# Patient Record
Sex: Female | Born: 1935 | Race: White | Hispanic: No | Marital: Married | Smoking: Never smoker
Health system: Southern US, Community
[De-identification: ages and names within clinical notes are randomized; demographics above are authoritative.]

## PROBLEM LIST (undated history)

## (undated) DIAGNOSIS — S22009A Unspecified fracture of unspecified thoracic vertebra, initial encounter for closed fracture: Secondary | ICD-10-CM

## (undated) DIAGNOSIS — R627 Adult failure to thrive: Secondary | ICD-10-CM

## (undated) DIAGNOSIS — S62109A Fracture of unspecified carpal bone, unspecified wrist, initial encounter for closed fracture: Secondary | ICD-10-CM

## (undated) DIAGNOSIS — A0472 Enterocolitis due to Clostridium difficile, not specified as recurrent: Secondary | ICD-10-CM

## (undated) DIAGNOSIS — M810 Age-related osteoporosis without current pathological fracture: Secondary | ICD-10-CM

## (undated) DIAGNOSIS — G609 Hereditary and idiopathic neuropathy, unspecified: Secondary | ICD-10-CM

## (undated) HISTORY — DX: Enterocolitis due to Clostridium difficile, not specified as recurrent: A04.72

## (undated) HISTORY — DX: Hereditary and idiopathic neuropathy, unspecified: G60.9

## (undated) HISTORY — DX: Adult failure to thrive: R62.7

## (undated) HISTORY — DX: Fracture of unspecified carpal bone, unspecified wrist, initial encounter for closed fracture: S62.109A

## (undated) HISTORY — DX: Unspecified fracture of unspecified thoracic vertebra, initial encounter for closed fracture: S22.009A

---

## 1998-03-22 ENCOUNTER — Other Ambulatory Visit: Admission: RE | Admit: 1998-03-22 | Discharge: 1998-03-22 | Payer: Self-pay

## 1998-11-23 ENCOUNTER — Encounter: Payer: Self-pay | Admitting: Emergency Medicine

## 1998-11-23 ENCOUNTER — Emergency Department (HOSPITAL_COMMUNITY): Admission: EM | Admit: 1998-11-23 | Discharge: 1998-11-23 | Payer: Self-pay | Admitting: Emergency Medicine

## 1999-12-01 ENCOUNTER — Inpatient Hospital Stay (HOSPITAL_COMMUNITY): Admission: EM | Admit: 1999-12-01 | Discharge: 1999-12-02 | Payer: Self-pay | Admitting: Family Medicine

## 2000-01-05 ENCOUNTER — Inpatient Hospital Stay (HOSPITAL_COMMUNITY): Admission: EM | Admit: 2000-01-05 | Discharge: 2000-01-15 | Payer: Self-pay | Admitting: Emergency Medicine

## 2000-01-05 ENCOUNTER — Encounter (INDEPENDENT_AMBULATORY_CARE_PROVIDER_SITE_OTHER): Payer: Self-pay

## 2000-01-15 ENCOUNTER — Inpatient Hospital Stay (HOSPITAL_COMMUNITY): Admission: EM | Admit: 2000-01-15 | Discharge: 2000-01-22 | Payer: Self-pay | Admitting: *Deleted

## 2001-11-30 ENCOUNTER — Encounter: Payer: Self-pay | Admitting: Internal Medicine

## 2001-11-30 ENCOUNTER — Ambulatory Visit (HOSPITAL_COMMUNITY): Admission: RE | Admit: 2001-11-30 | Discharge: 2001-11-30 | Payer: Self-pay | Admitting: Internal Medicine

## 2002-04-29 ENCOUNTER — Ambulatory Visit (HOSPITAL_COMMUNITY): Admission: RE | Admit: 2002-04-29 | Discharge: 2002-04-29 | Payer: Self-pay | Admitting: Internal Medicine

## 2002-04-29 ENCOUNTER — Encounter: Payer: Self-pay | Admitting: Internal Medicine

## 2004-01-29 ENCOUNTER — Ambulatory Visit (HOSPITAL_COMMUNITY): Admission: RE | Admit: 2004-01-29 | Discharge: 2004-01-29 | Payer: Self-pay | Admitting: Internal Medicine

## 2004-10-10 ENCOUNTER — Ambulatory Visit: Payer: Self-pay | Admitting: Internal Medicine

## 2005-01-07 ENCOUNTER — Ambulatory Visit: Payer: Self-pay | Admitting: Internal Medicine

## 2005-04-23 ENCOUNTER — Ambulatory Visit: Payer: Self-pay | Admitting: Internal Medicine

## 2005-05-01 ENCOUNTER — Ambulatory Visit: Payer: Self-pay | Admitting: Family Medicine

## 2006-09-23 ENCOUNTER — Ambulatory Visit: Payer: Self-pay | Admitting: Internal Medicine

## 2006-10-16 ENCOUNTER — Ambulatory Visit: Payer: Self-pay | Admitting: Internal Medicine

## 2006-10-17 ENCOUNTER — Ambulatory Visit: Payer: Self-pay | Admitting: Internal Medicine

## 2007-03-12 ENCOUNTER — Emergency Department (HOSPITAL_COMMUNITY): Admission: EM | Admit: 2007-03-12 | Discharge: 2007-03-12 | Payer: Self-pay | Admitting: Emergency Medicine

## 2007-03-25 ENCOUNTER — Encounter: Admission: RE | Admit: 2007-03-25 | Discharge: 2007-03-25 | Payer: Self-pay | Admitting: Orthopedic Surgery

## 2007-04-30 ENCOUNTER — Encounter: Admission: RE | Admit: 2007-04-30 | Discharge: 2007-04-30 | Payer: Self-pay | Admitting: Orthopedic Surgery

## 2007-05-25 ENCOUNTER — Ambulatory Visit: Payer: Self-pay | Admitting: Internal Medicine

## 2007-05-25 LAB — CONVERTED CEMR LAB
AST: 30 units/L (ref 0–37)
Albumin: 3.2 g/dL — ABNORMAL LOW (ref 3.5–5.2)
Basophils Absolute: 0 10*3/uL (ref 0.0–0.1)
Bilirubin, Direct: 0.1 mg/dL (ref 0.0–0.3)
Chloride: 99 meq/L (ref 96–112)
Cholesterol: 166 mg/dL (ref 0–200)
Creatinine, Ser: 0.6 mg/dL (ref 0.4–1.2)
Eosinophils Absolute: 0.1 10*3/uL (ref 0.0–0.6)
Eosinophils Relative: 2.7 % (ref 0.0–5.0)
Glucose, Bld: 90 mg/dL (ref 70–99)
HCT: 37.1 % (ref 36.0–46.0)
Hemoglobin: 13.1 g/dL (ref 12.0–15.0)
MCHC: 35.2 g/dL (ref 30.0–36.0)
MCV: 90.1 fL (ref 78.0–100.0)
Monocytes Absolute: 1.3 10*3/uL — ABNORMAL HIGH (ref 0.2–0.7)
Neutrophils Relative %: 37.5 % — ABNORMAL LOW (ref 43.0–77.0)
Potassium: 3.7 meq/L (ref 3.5–5.1)
RBC: 4.12 M/uL (ref 3.87–5.11)
RDW: 12.1 % (ref 11.5–14.6)
Sodium: 139 meq/L (ref 135–145)
TSH: 1.67 microintl units/mL (ref 0.35–5.50)
Total Bilirubin: 0.6 mg/dL (ref 0.3–1.2)
Total CHOL/HDL Ratio: 3.1
WBC: 5 10*3/uL (ref 4.5–10.5)

## 2007-09-06 DIAGNOSIS — F3289 Other specified depressive episodes: Secondary | ICD-10-CM | POA: Insufficient documentation

## 2007-09-06 DIAGNOSIS — F411 Generalized anxiety disorder: Secondary | ICD-10-CM | POA: Insufficient documentation

## 2007-09-06 DIAGNOSIS — S62109A Fracture of unspecified carpal bone, unspecified wrist, initial encounter for closed fracture: Secondary | ICD-10-CM

## 2007-09-06 DIAGNOSIS — F329 Major depressive disorder, single episode, unspecified: Secondary | ICD-10-CM

## 2007-09-06 DIAGNOSIS — Z85828 Personal history of other malignant neoplasm of skin: Secondary | ICD-10-CM | POA: Insufficient documentation

## 2007-09-06 HISTORY — DX: Fracture of unspecified carpal bone, unspecified wrist, initial encounter for closed fracture: S62.109A

## 2007-09-09 ENCOUNTER — Encounter: Payer: Self-pay | Admitting: Internal Medicine

## 2007-09-09 DIAGNOSIS — M810 Age-related osteoporosis without current pathological fracture: Secondary | ICD-10-CM | POA: Insufficient documentation

## 2007-09-09 DIAGNOSIS — A0472 Enterocolitis due to Clostridium difficile, not specified as recurrent: Secondary | ICD-10-CM

## 2007-09-09 DIAGNOSIS — R519 Headache, unspecified: Secondary | ICD-10-CM | POA: Insufficient documentation

## 2007-09-09 DIAGNOSIS — E785 Hyperlipidemia, unspecified: Secondary | ICD-10-CM

## 2007-09-09 DIAGNOSIS — R51 Headache: Secondary | ICD-10-CM | POA: Insufficient documentation

## 2007-09-09 HISTORY — DX: Enterocolitis due to Clostridium difficile, not specified as recurrent: A04.72

## 2007-10-07 ENCOUNTER — Telehealth (INDEPENDENT_AMBULATORY_CARE_PROVIDER_SITE_OTHER): Payer: Self-pay | Admitting: *Deleted

## 2007-10-07 ENCOUNTER — Ambulatory Visit: Payer: Self-pay | Admitting: Internal Medicine

## 2007-10-08 ENCOUNTER — Encounter: Payer: Self-pay | Admitting: Internal Medicine

## 2007-10-28 ENCOUNTER — Telehealth (INDEPENDENT_AMBULATORY_CARE_PROVIDER_SITE_OTHER): Payer: Self-pay | Admitting: *Deleted

## 2007-11-08 ENCOUNTER — Ambulatory Visit: Payer: Self-pay | Admitting: Internal Medicine

## 2007-12-27 ENCOUNTER — Telehealth (INDEPENDENT_AMBULATORY_CARE_PROVIDER_SITE_OTHER): Payer: Self-pay | Admitting: *Deleted

## 2007-12-28 ENCOUNTER — Ambulatory Visit: Payer: Self-pay | Admitting: Internal Medicine

## 2007-12-29 ENCOUNTER — Encounter: Payer: Self-pay | Admitting: Internal Medicine

## 2007-12-29 DIAGNOSIS — S22009A Unspecified fracture of unspecified thoracic vertebra, initial encounter for closed fracture: Secondary | ICD-10-CM

## 2007-12-29 HISTORY — DX: Unspecified fracture of unspecified thoracic vertebra, initial encounter for closed fracture: S22.009A

## 2008-01-03 ENCOUNTER — Encounter: Admission: RE | Admit: 2008-01-03 | Discharge: 2008-01-03 | Payer: Self-pay | Admitting: Internal Medicine

## 2008-01-06 ENCOUNTER — Encounter: Payer: Self-pay | Admitting: Internal Medicine

## 2008-02-15 ENCOUNTER — Encounter: Payer: Self-pay | Admitting: Interventional Radiology

## 2008-02-22 ENCOUNTER — Ambulatory Visit (HOSPITAL_COMMUNITY): Admission: RE | Admit: 2008-02-22 | Discharge: 2008-02-22 | Payer: Self-pay | Admitting: Interventional Radiology

## 2008-03-09 ENCOUNTER — Encounter: Payer: Self-pay | Admitting: Interventional Radiology

## 2008-03-09 ENCOUNTER — Telehealth: Payer: Self-pay | Admitting: Internal Medicine

## 2008-06-14 ENCOUNTER — Ambulatory Visit: Payer: Self-pay | Admitting: Internal Medicine

## 2008-06-14 LAB — CONVERTED CEMR LAB
AST: 30 units/L (ref 0–37)
Albumin: 3.8 g/dL (ref 3.5–5.2)
BUN: 16 mg/dL (ref 6–23)
Basophils Absolute: 0.1 10*3/uL (ref 0.0–0.1)
Basophils Relative: 2.2 % — ABNORMAL HIGH (ref 0.0–1.0)
Calcium: 9.5 mg/dL (ref 8.4–10.5)
Chloride: 100 meq/L (ref 96–112)
Cholesterol: 163 mg/dL (ref 0–200)
Creatinine, Ser: 0.7 mg/dL (ref 0.4–1.2)
Eosinophils Absolute: 0 10*3/uL (ref 0.0–0.7)
Eosinophils Relative: 0.4 % (ref 0.0–5.0)
GFR calc non Af Amer: 87 mL/min
HCT: 40.4 % (ref 36.0–46.0)
Hemoglobin: 14 g/dL (ref 12.0–15.0)
MCHC: 34.6 g/dL (ref 30.0–36.0)
MCV: 90.2 fL (ref 78.0–100.0)
Monocytes Absolute: 1.8 10*3/uL — ABNORMAL HIGH (ref 0.1–1.0)
Neutro Abs: 1.6 10*3/uL (ref 1.4–7.7)
Neutrophils Relative %: 31.1 % — ABNORMAL LOW (ref 43.0–77.0)
RBC: 4.48 M/uL (ref 3.87–5.11)
TSH: 1.37 microintl units/mL (ref 0.35–5.50)
WBC: 5.1 10*3/uL (ref 4.5–10.5)

## 2008-06-15 LAB — CONVERTED CEMR LAB: Vit D, 1,25-Dihydroxy: 46 (ref 30–89)

## 2008-08-08 ENCOUNTER — Ambulatory Visit: Payer: Self-pay | Admitting: Internal Medicine

## 2008-08-08 ENCOUNTER — Telehealth (INDEPENDENT_AMBULATORY_CARE_PROVIDER_SITE_OTHER): Payer: Self-pay | Admitting: *Deleted

## 2008-08-08 DIAGNOSIS — G609 Hereditary and idiopathic neuropathy, unspecified: Secondary | ICD-10-CM

## 2008-08-08 DIAGNOSIS — R627 Adult failure to thrive: Secondary | ICD-10-CM

## 2008-08-08 HISTORY — DX: Adult failure to thrive: R62.7

## 2008-08-08 HISTORY — DX: Hereditary and idiopathic neuropathy, unspecified: G60.9

## 2008-08-18 ENCOUNTER — Encounter: Payer: Self-pay | Admitting: Internal Medicine

## 2008-08-23 ENCOUNTER — Encounter: Payer: Self-pay | Admitting: Internal Medicine

## 2008-08-28 ENCOUNTER — Encounter: Payer: Self-pay | Admitting: Internal Medicine

## 2008-09-21 ENCOUNTER — Encounter: Payer: Self-pay | Admitting: Internal Medicine

## 2008-10-02 ENCOUNTER — Ambulatory Visit: Payer: Self-pay | Admitting: Internal Medicine

## 2008-10-03 ENCOUNTER — Encounter: Payer: Self-pay | Admitting: Internal Medicine

## 2008-10-06 ENCOUNTER — Telehealth (INDEPENDENT_AMBULATORY_CARE_PROVIDER_SITE_OTHER): Payer: Self-pay | Admitting: *Deleted

## 2008-10-10 ENCOUNTER — Encounter: Payer: Self-pay | Admitting: Internal Medicine

## 2008-10-11 ENCOUNTER — Encounter: Payer: Self-pay | Admitting: Internal Medicine

## 2008-10-20 ENCOUNTER — Telehealth (INDEPENDENT_AMBULATORY_CARE_PROVIDER_SITE_OTHER): Payer: Self-pay | Admitting: *Deleted

## 2008-10-24 ENCOUNTER — Encounter: Payer: Self-pay | Admitting: Internal Medicine

## 2008-12-06 ENCOUNTER — Encounter: Payer: Self-pay | Admitting: Internal Medicine

## 2009-03-30 IMAGING — CR DG LUMBAR SPINE COMPLETE 4+V
5 series · 5 of 5 positions shown · non-contrast
Comparison: none

CLINICAL DATA: Severe low back pain.  No injury. 
LUMBAR SPINE FOUR VIEWS:
Four views of the lumbar spine were obtained.  There is a compression deformity of T12 vertebral body by approximately 40%.  The lumbar vertebrae are in normal alignment with normal disc spaces.  The bones are diffusely osteopenic.   No definite retropulsion is seen.  The SI joints appear normal.

[t l-spine a.p.]
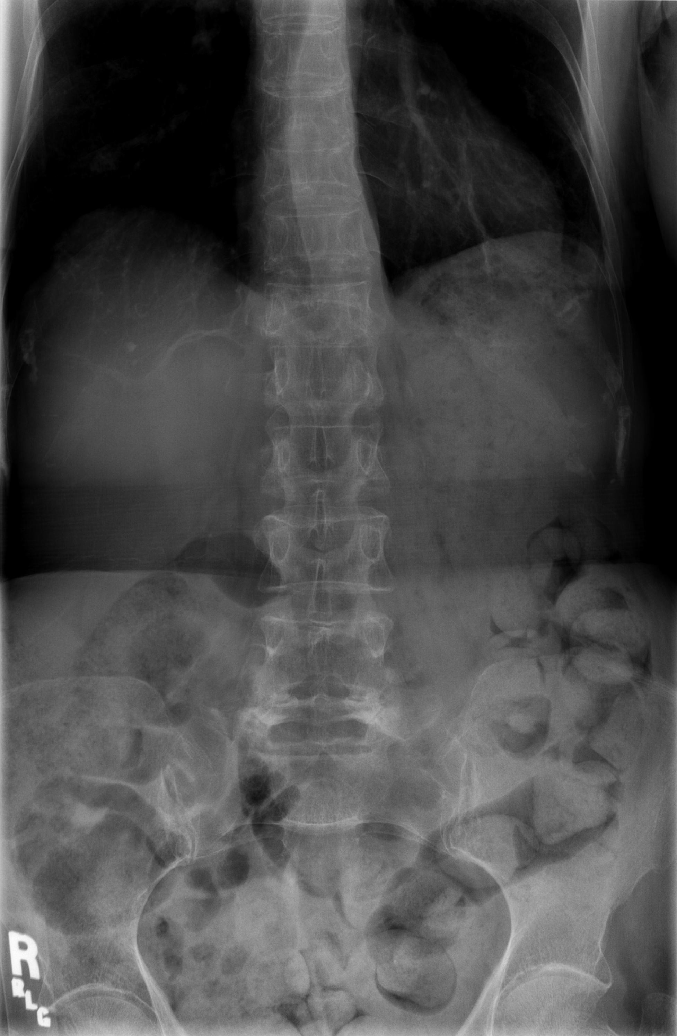

[t l-spine oblique exposure (1 of 2)]
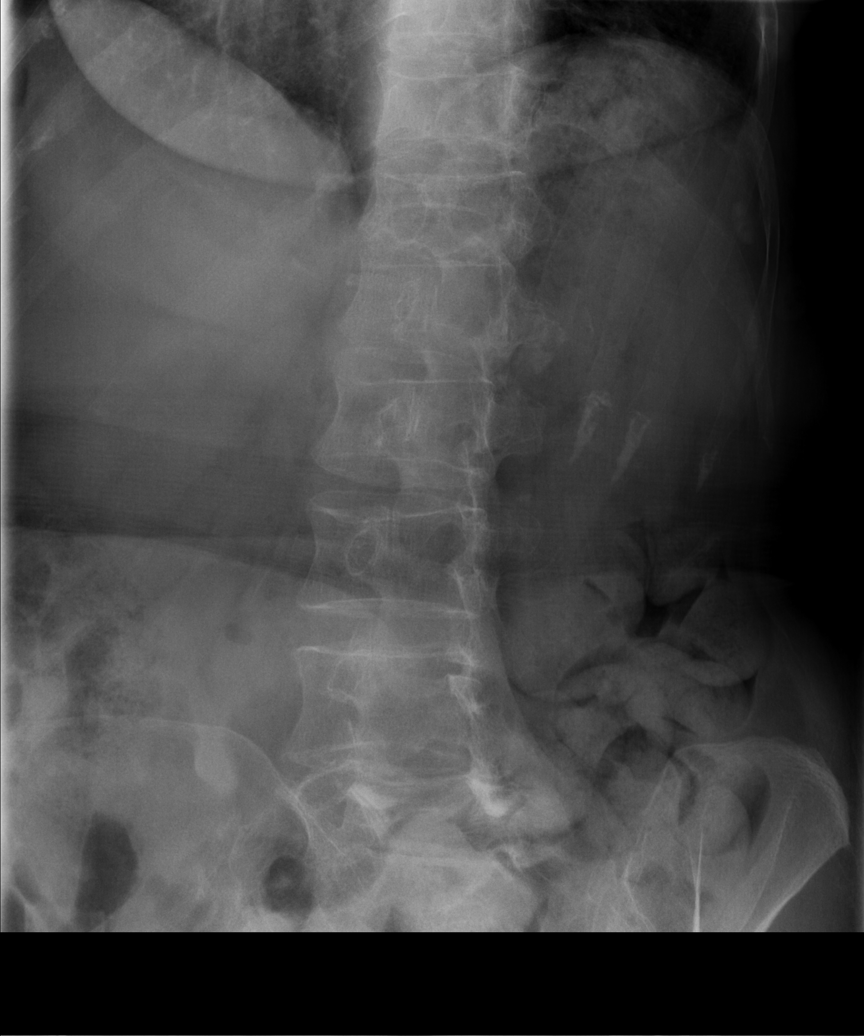

[t l-spine oblique exposure (2 of 2)]
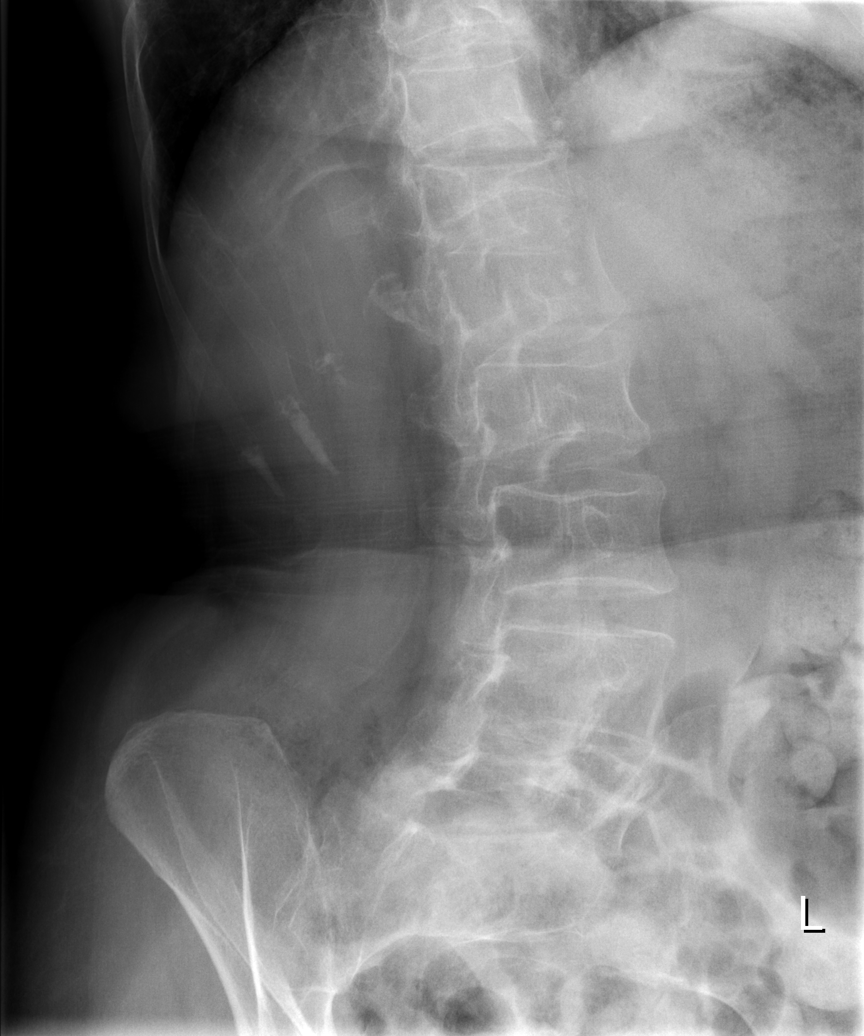

[t l-spine lat]
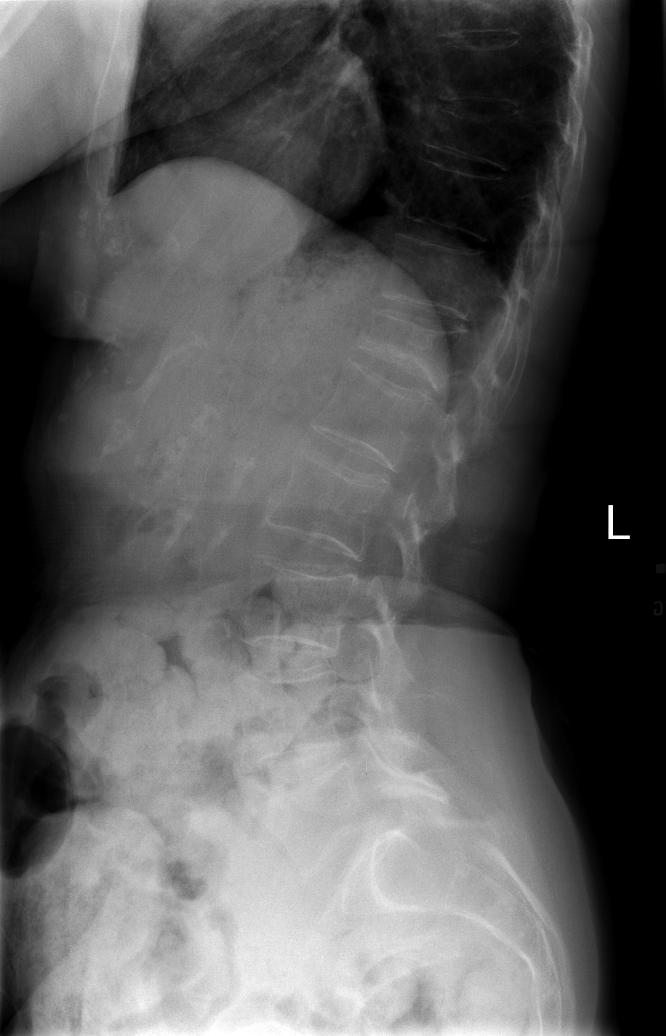

[t l-spine l5-s1 spot]
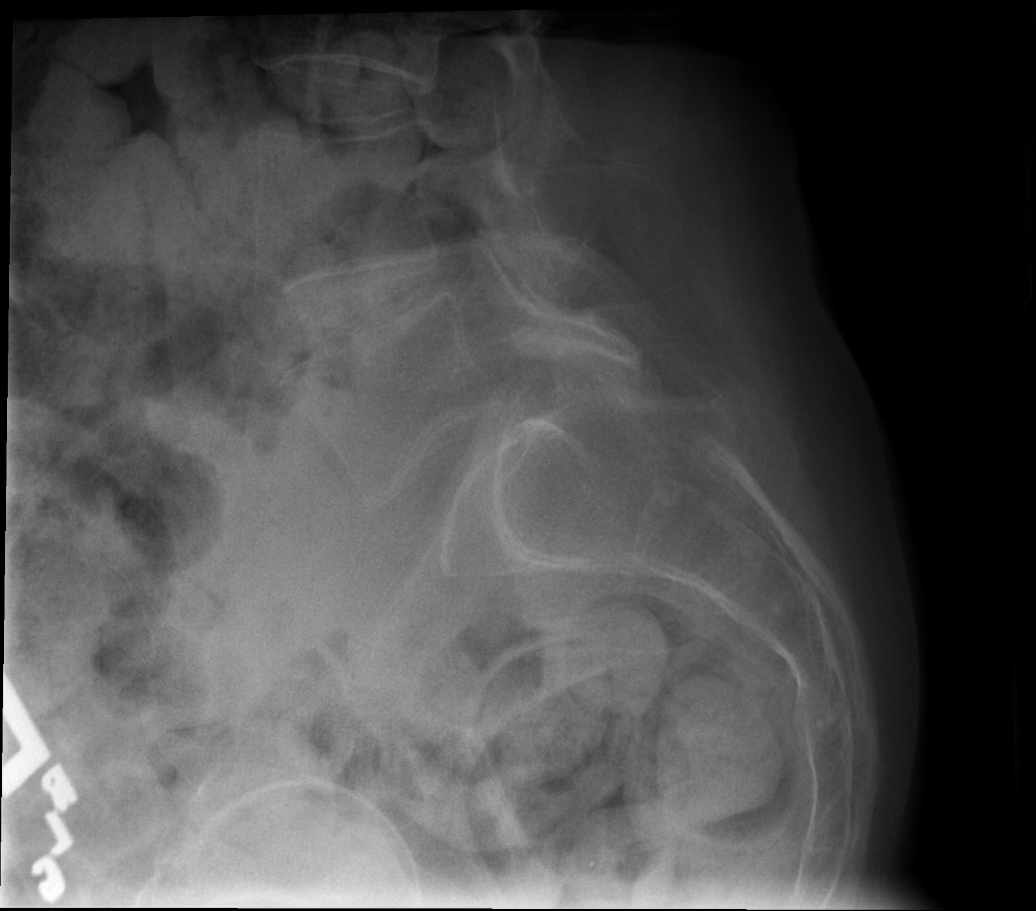

[5 of 5 positions shown; findings below may reference images not displayed]

IMPRESSION: Approximately 40% compression deformity of T12.  No evidence of retropulsion.  Normal alignment.

## 2009-10-01 ENCOUNTER — Telehealth: Payer: Self-pay | Admitting: Internal Medicine

## 2009-10-02 ENCOUNTER — Telehealth: Payer: Self-pay | Admitting: Internal Medicine

## 2009-10-11 ENCOUNTER — Ambulatory Visit: Payer: Self-pay | Admitting: Internal Medicine

## 2009-10-11 LAB — CONVERTED CEMR LAB
AST: 38 units/L — ABNORMAL HIGH (ref 0–37)
Alkaline Phosphatase: 244 units/L — ABNORMAL HIGH (ref 39–117)
BUN: 17 mg/dL (ref 6–23)
Creatinine, Ser: 0.6 mg/dL (ref 0.4–1.2)
Eosinophils Relative: 1.9 % (ref 0.0–5.0)
GFR calc non Af Amer: 103.01 mL/min (ref >60)
HCT: 42 % (ref 36.0–46.0)
HDL: 67.7 mg/dL (ref >39.00)
Ketones, ur: NEGATIVE mg/dL
LDL Cholesterol: 89 mg/dL (ref 0–99)
Monocytes Relative: 34.4 % — ABNORMAL HIGH (ref 3.0–12.0)
Neutrophils Relative %: 29.2 % — ABNORMAL LOW (ref 43.0–77.0)
Platelets: 207 10*3/uL (ref 150.0–400.0)
Potassium: 3.9 meq/L (ref 3.5–5.1)
RBC: 4.53 M/uL (ref 3.87–5.11)
Specific Gravity, Urine: 1.015 (ref 1.000–1.030)
Total Bilirubin: 0.6 mg/dL (ref 0.3–1.2)
Total CHOL/HDL Ratio: 2
Total Protein, Urine: NEGATIVE mg/dL
Triglycerides: 58 mg/dL (ref 0.0–149.0)
Urine Glucose: NEGATIVE mg/dL
Vitamin B-12: 1155 pg/mL — ABNORMAL HIGH (ref 211–911)
WBC: 5.4 10*3/uL (ref 4.5–10.5)
pH: 8.5 (ref 5.0–8.0)

## 2009-10-12 ENCOUNTER — Encounter: Payer: Self-pay | Admitting: Internal Medicine

## 2009-10-15 ENCOUNTER — Telehealth: Payer: Self-pay | Admitting: Internal Medicine

## 2009-12-01 ENCOUNTER — Emergency Department (HOSPITAL_COMMUNITY): Admission: EM | Admit: 2009-12-01 | Discharge: 2009-12-01 | Payer: Self-pay | Admitting: Emergency Medicine

## 2009-12-01 ENCOUNTER — Telehealth: Payer: Self-pay | Admitting: Internal Medicine

## 2009-12-03 ENCOUNTER — Ambulatory Visit: Payer: Self-pay | Admitting: Internal Medicine

## 2009-12-10 ENCOUNTER — Telehealth: Payer: Self-pay | Admitting: Internal Medicine

## 2011-03-17 LAB — CBC
Hemoglobin: 13.6 g/dL (ref 12.0–15.0)
MCHC: 33.6 g/dL (ref 30.0–36.0)
RBC: 4.5 MIL/uL (ref 3.87–5.11)

## 2011-03-17 LAB — DIFFERENTIAL
Basophils Relative: 0 % (ref 0–1)
Eosinophils Absolute: 0.1 10*3/uL (ref 0.0–0.7)
Eosinophils Relative: 2 % (ref 0–5)
Lymphocytes Relative: 30 % (ref 12–46)
Monocytes Relative: 30 % — ABNORMAL HIGH (ref 3–12)
Neutro Abs: 2.1 10*3/uL (ref 1.7–7.7)

## 2011-03-17 LAB — POCT CARDIAC MARKERS
CKMB, poc: 1.8 ng/mL (ref 1.0–8.0)
CKMB, poc: 2.1 ng/mL (ref 1.0–8.0)
Myoglobin, poc: 75.8 ng/mL (ref 12–200)
Troponin i, poc: <0.05 ng/mL (ref 0.00–0.09)
Troponin i, poc: <0.05 ng/mL (ref 0.00–0.09)

## 2011-03-17 LAB — BASIC METABOLIC PANEL
CO2: 28 mEq/L (ref 19–32)
Calcium: 9 mg/dL (ref 8.4–10.5)
GFR calc Af Amer: >60 mL/min (ref >60)
Sodium: 133 mEq/L — ABNORMAL LOW (ref 135–145)

## 2011-03-17 LAB — D-DIMER, QUANTITATIVE: D-Dimer, Quant: 1.33 ug/mL-FEU — ABNORMAL HIGH (ref 0.00–0.48)

## 2011-04-29 NOTE — Consult Note (Signed)
NAME:  Allison Callahan, Allison Callahan           ACCOUNT NO.:  [id number]   MEDICAL RECORD NO.:  [id number]          PATIENT TYPE:  OUT   LOCATION:  XRAY                         FACILITY:  [facility]   PHYSICIAN:  Sanjeev K. Deveshwar, M.D.DATE OF BIRTH:  Apr 20, 1930   DATE OF CONSULTATION:  02/15/2008  DATE OF DISCHARGE:                                 CONSULTATION   DATE OF CONSULTATION:  February 15, 2008.   CHIEF COMPLAINT:  Compression fractures.   HISTORY OF PRESENT ILLNESS:  This is a very pleasant 77 year old female  who was referred to Dr. Corliss Skains for evaluation of compression  fractures.  The patient was seen on January 06, 2008.  She had an MRI  performed January 03, 2008 that showed severe subacute compression  fractures at T8 and T12.  She had an old healed fracture at T9.  At that  time  Dr. Corliss Skains had recommended a vertebroplasty due to the severity of  the fractures.  He did feel that T8 could be treated.  However, T12 was  too compressed, and he did not feel it would be safe to treat this  fracture.  The fracture at T9 was old and healed, and no intervention  was indicated here as well.  At that time Dr. Corliss Skains  spent  approximately 40 minutes with the patient and her husband describing the  kyphoplasty and vertebroplasty procedures including the risks and  benefits.  He reviewed the results of the MRI, and showed them the  images.  The patient and her husband decided that they wanted to give  the procedures some more thought before scheduling.  We recently heard  back from them.  They requested a repeat consult and were seen today  February 15, 2008.   PAST MEDICAL HISTORY:  Significant for hyperlipidemia, anxiety,  depression, osteoporosis.  The patient has had several falls.  She had a  right metatarsal fracture in March 2008, and was treated by Dr. Montez Morita.  She has a history of a C. difficile infection.  She has a history of  headaches.  She had a chest x-ray December 28, 2007  that showed new  cardiomegaly with small bilateral pleural effusions with question of  COPD, although the patient has never smoked.   PAST SURGICAL HISTORY:  The patient is status post appendectomy.  She  denies previous problems with anesthesia.   ALLERGIES:  No known drug allergies.   MEDICATIONS:  The patient had previously been on Darvocet and Flexeril  p.r.n. for her back pain.  She also was on vitamins and Os-Cal.   SOCIAL HISTORY:  The patient is married.  They have one adult son who  lives in [place].  The patient and her husband live in [place].  She has never smoked or used alcohol.  She is a retired TEFL teacher.   FAMILY HISTORY:  Her mother died at age 60 from congestive heart  failure.  Father died at age 71 from a CVA.   IMPRESSION/PLAN:  As noted the patient and her husband requested a  follow-up visit for further discussions regarding treatment  of the  patient's compression fracture.  Since our last visit the patient's  husband has been on the Internet and also to the [facility]  He has done his own research on the vertebroplasty and  kyphoplasty procedures, and is convinced that his wife should now have  the procedure for relief of her pain and stabilization of the fracture.   Once again Dr. Corliss Skains did review the results of the patient's MRI  that was performed on January 03, 2008.  He again pointed out the severe  fractures at T8 and T12.  He felt T8 was the only fracture that could be  treated, although the patient's fracture could have actually worsened  since the time of the MRI which again was January 03, 2008.   Apparently the patient's pain has improved somewhat.  She previously  described her pain as an 8 on a 1-10 scale.  She now says it is a 4 on a  1-10 scale.  However, I believe her husband thinks that the pain is  actually worse.  He still feels that his wife is quite debilitated, and   is unable to participate in her usual household activities as well as  their social activities.  She had been on Darvocet and Flexeril for  pain.  She now rarely takes any pain medication.  However, this may be  due to the patient's fear of dependency on these medications.   Dr. Corliss Skains again spent greater than 40 minutes with the patient and  her husband discussing the treatment options.  Dr. Corliss Skains felt that  since the patient has improved that it might be beneficial to give her  more time to see if she will improve completely.  The patient and her  husband are concerned that the fracture may continue to get worse, and  they would like to proceed with treatment.  We have scheduled her for a  vertebroplasty at the T8 level to be performed Tuesday, March 10, at  12:30.  All of their questions were answered.  The patient and her  husband are in agreement with this plan.      Delton See, P.A.    ______________________________  Grandville Silos. Corliss Skains, M.D.    DR/MEDQ  D:  02/15/2008  T:  02/16/2008  Job:  [id number]   cc:   Corwin Levins, MD

## 2011-04-29 NOTE — Consult Note (Signed)
NAME:  Allison Callahan, Allison Callahan           ACCOUNT NO.:  [id number]   MEDICAL RECORD NO.:  [id number]          PATIENT TYPE:  OUT   LOCATION:  XRAY                         FACILITY:  [facility]   PHYSICIAN:  Sanjeev K. Deveshwar, M.D.DATE OF BIRTH:  [birth year]   DATE OF CONSULTATION:  01/06/2008  DATE OF DISCHARGE:                                 CONSULTATION   CHIEF COMPLAINT:  Compression fracture.   HISTORY OF PRESENT ILLNESS:  This is a very pleasant 77 year old female  who developed back pain on January 8 which came on suddenly.  The  patient denies any history of fall or injury.  She did have a fall back  in March 2008, at which time she injured her leg and had some back pain  associated with this.  However, this current pain appears to be a new  pain.  The patient has had an MRI of the thoracic spine that was  performed on January 03, 2008.  This showed T8 and T12 compression  fractures with subacute components.  There was an old healed fracture at  the T9 level, which appeared to be stable.  The patient states her pain  has been an 8 on a 1-10 scale.  It has been associated with shortness of  breath and inability to drive a car as well as inability to perform her  usual activities of daily living.  She is currently being treated  Darvocet and Flexeril which does provide some relief.  However, she is  still in a great deal of pain.  She presents today accompanied by her  husband to be seen and evaluated by Dr. Corliss Skains.   PAST MEDICAL HISTORY:  1. Hyperlipidemia.  2. Anxiety.  3. Depression.  4. Osteoporosis.  5. As noted, she had a fall in March 2008, and had a right metatarsal      fracture.  She was seen by Dr. Montez Morita.  6. She has a previous history of Clostridium difficile.  7. History of headaches.  8. She had a chest x-ray on December 28, 2007 that showed new      cardiomegaly, with small bilateral pleural effusions, with a      question of COPD. However, the patient has never  smoked.   SURGICAL HISTORY:  1. The patient is status post appendectomy.  2. She denies any previous problems with anesthesia.   ALLERGIES:  NO KNOWN DRUG ALLERGIES.  She denies allergies to shrimp,  iodine, shellfish, or latex.   CURRENT MEDICATIONS:  Darvocet and Flexeril p.r.n.  She also takes  vitamins and Os-Cal.   SOCIAL HISTORY:  The patient is married.  They have one adult son, who  lives in [place].  The patient and her husband live here in  [place].  She has never smoked or used alcohol.  She is a retired  Dietitian.   FAMILY HISTORY:  Her mother died at age 75 from congestive heart  failure.  Her father died at age 11 from a CVA.   IMPRESSION AND PLAN:  As noted, the patient has two subacute compression  fractures on her  recent MRI performed January 03, 2008.  She has been in  a great deal of pain despite pain medications and limited mobility.  Dr.  Corliss Skains reviewed the images from her MRI with the patient and her  husband.  He pointed out the fractures.  The T12 fractures is quite  severe and has some degree of retropulsion associated with the fracture.  Dr. Corliss Skains felt that both fractures would require a vertebroplasty as  opposed to a kyphoplasty due to the severity of the fractures.  As  noted, the T9 fracture is old and has already healed and stabilized.   The vertebroplasty and kyphoplasty procedures were described in great  detail, along with the potential benefits and all of the risks,  including but not limited to subsequent fractures, bleeding, infection,  extravasation of the bone cement, as well as potential injury to the  spinal cord itself.  They were also given some written information on  the kyphoplasty procedure to study at home.   The patient and her husband would like to give this procedure some more  thought.  They will return home and call us if they decide to proceed  with the intervention.  Greater than 40  minutes was spent on this  consult.      Delton See, P.A.    ______________________________  Grandville Silos. Corliss Skains, M.D.    DR/MEDQ  D:  01/06/2008  T:  01/07/2008  Job:  [id number]   cc:   Corwin Levins, MD

## 2011-04-29 NOTE — Assessment & Plan Note (Signed)
[facility]                                 ON-CALL NOTE   NAME:Allison Callahan, PLAKE                    MRN:          [id number]  DATE:12/25/2007                            DOB:          January 30, 1930    TIME OF CALL:  9:05am   TELEPHONE NUMBER:  [phone]   CALLER:  Barnett Abu, husband.   The patient has severe back pain since Thursday. She may have taken too  much medication. She is taking Tylenol basically. She is taking 2 every  4 hours, sometimes every 6 hours of extra strength. However, she only  took 3 on Thursday, took 4 doses on Friday. I spoke with the patient and  the husband simultaneously on the phone. I told her that she could take  extra strength Tylenol 4 times a day routinely for a while without  difficulty as she has no liver problems and is on no other medications  that are a problem. If her pain does not improve, to the ER for  evaluation.   PRIMARY CARE PHYSICIAN:  Dr. Jonny Ruiz.   HOME OFFICE:  Hope Budds, MD  Electronically Signed    RNS/MedQ  DD: 12/25/2007  DT: 12/26/2007  Job #: [id number]

## 2011-05-02 NOTE — Assessment & Plan Note (Signed)
[facility]                            CARDIOLOGY OFFICE NOTE   NAME:Allison Callahan, Allison Callahan                    MRN:          [id number]  DATE:07/07/2007                            DOB:          [birth year]    Ms. Allison Stamwitz fell bringing her husband to have a CT scan today at  the [facility]  She scraped her right knee which did not look  fractured to my visualization.  She also hit her right wrist.  She has  had a previous fracture of her left wrist with a fall.   We sent her over to [facility].  Dr. Farris Has just called me  and said that there were no fractures.  He will prescription appropriate  management at this point.     Thomas C. Daleen Squibb, MD, [facility]  Electronically Signed    TCW/MedQ  DD: 07/07/2007  DT: 07/07/2007  Job #: [id number]   cc:   Dr. Jonny Ruiz

## 2011-09-08 LAB — BASIC METABOLIC PANEL
Calcium: 9.6
GFR calc Af Amer: >60
GFR calc non Af Amer: >60
Potassium: 3.7
Sodium: 139

## 2011-09-08 LAB — CBC
HCT: 39.2
Hemoglobin: 13.6
WBC: 4

## 2011-09-08 LAB — PROTIME-INR: INR: 0.9

## 2014-08-16 ENCOUNTER — Telehealth: Payer: Self-pay | Admitting: Internal Medicine

## 2014-08-16 NOTE — Telephone Encounter (Signed)
Pt has not seen dr Jonny Ruiz in over 3 years and would like to est with dr kim along with her husband. Can I sch?

## 2014-08-16 NOTE — Telephone Encounter (Signed)
Per Dr Selena Batten let pt know her next appt is 6 months out if they do not mind waiting-if so she would recommend they see the new doctor at [facility].   Ronnald Collum

## 2014-08-16 NOTE — Telephone Encounter (Signed)
Pt hus is aware will call back to sch

## 2014-09-12 ENCOUNTER — Other Ambulatory Visit: Payer: Self-pay

## 2014-10-06 ENCOUNTER — Emergency Department (HOSPITAL_COMMUNITY)
Admission: EM | Admit: 2014-10-06 | Discharge: 2014-10-07 | Disposition: A | Discharge status: Home / Self Care | Payer: Medicare Other | Attending: Emergency Medicine | Admitting: Emergency Medicine

## 2014-10-06 ENCOUNTER — Emergency Department (HOSPITAL_COMMUNITY): Payer: Medicare Other

## 2014-10-06 ENCOUNTER — Encounter (HOSPITAL_COMMUNITY): Payer: Self-pay | Admitting: Emergency Medicine

## 2014-10-06 DIAGNOSIS — Z79899 Other long term (current) drug therapy: Secondary | ICD-10-CM | POA: Insufficient documentation

## 2014-10-06 DIAGNOSIS — R4182 Altered mental status, unspecified: Secondary | ICD-10-CM | POA: Diagnosis not present

## 2014-10-06 DIAGNOSIS — F4321 Adjustment disorder with depressed mood: Secondary | ICD-10-CM | POA: Diagnosis present

## 2014-10-06 DIAGNOSIS — R531 Weakness: Secondary | ICD-10-CM | POA: Diagnosis present

## 2014-10-06 DIAGNOSIS — M810 Age-related osteoporosis without current pathological fracture: Secondary | ICD-10-CM | POA: Diagnosis not present

## 2014-10-06 HISTORY — DX: Age-related osteoporosis without current pathological fracture: M81.0

## 2014-10-06 LAB — CBC WITH DIFFERENTIAL/PLATELET
BASOS ABS: 0 10*3/uL (ref 0.0–0.1)
BASOS PCT: 0 % (ref 0–1)
EOS ABS: 0 10*3/uL (ref 0.0–0.7)
EOS PCT: 1 % (ref 0–5)
HEMATOCRIT: 38.4 % (ref 36.0–46.0)
Hemoglobin: 13.2 g/dL (ref 12.0–15.0)
Lymphocytes Relative: 37 % (ref 12–46)
Lymphs Abs: 1.3 10*3/uL (ref 0.7–4.0)
MCH: 30.2 pg (ref 26.0–34.0)
MCHC: 34.4 g/dL (ref 30.0–36.0)
MCV: 87.9 fL (ref 78.0–100.0)
MONO ABS: 1.1 10*3/uL — AB (ref 0.1–1.0)
Monocytes Relative: 32 % — ABNORMAL HIGH (ref 3–12)
Neutro Abs: 1.1 10*3/uL — ABNORMAL LOW (ref 1.7–7.7)
Neutrophils Relative %: 30 % — ABNORMAL LOW (ref 43–77)
PLATELETS: 134 10*3/uL — AB (ref 150–400)
RBC: 4.37 MIL/uL (ref 3.87–5.11)
RDW: 12.4 % (ref 11.5–15.5)
WBC: 3.5 10*3/uL — ABNORMAL LOW (ref 4.0–10.5)

## 2014-10-06 LAB — URINALYSIS, ROUTINE W REFLEX MICROSCOPIC
Bilirubin Urine: NEGATIVE
GLUCOSE, UA: NEGATIVE mg/dL
Hgb urine dipstick: NEGATIVE
Ketones, ur: NEGATIVE mg/dL
LEUKOCYTES UA: NEGATIVE
Nitrite: NEGATIVE
Protein, ur: NEGATIVE mg/dL
SPECIFIC GRAVITY, URINE: 1.011 (ref 1.005–1.030)
UROBILINOGEN UA: 0.2 mg/dL (ref 0.0–1.0)
pH: 7.5 (ref 5.0–8.0)

## 2014-10-06 LAB — COMPREHENSIVE METABOLIC PANEL
ALT: 15 U/L (ref 0–35)
AST: 27 U/L (ref 0–37)
Albumin: 3.3 g/dL — ABNORMAL LOW (ref 3.5–5.2)
Alkaline Phosphatase: 100 U/L (ref 39–117)
Anion gap: 11 (ref 5–15)
BUN: 17 mg/dL (ref 6–23)
CALCIUM: 8.9 mg/dL (ref 8.4–10.5)
CO2: 29 mEq/L (ref 19–32)
Chloride: 97 mEq/L (ref 96–112)
Creatinine, Ser: 0.55 mg/dL (ref 0.50–1.10)
GFR calc Af Amer: >90 mL/min (ref >90)
GFR calc non Af Amer: 85 mL/min — ABNORMAL LOW (ref >90)
Glucose, Bld: 81 mg/dL (ref 70–99)
Potassium: 4 mEq/L (ref 3.7–5.3)
SODIUM: 137 meq/L (ref 137–147)
TOTAL PROTEIN: 6.2 g/dL (ref 6.0–8.3)
Total Bilirubin: 0.5 mg/dL (ref 0.3–1.2)

## 2014-10-06 LAB — I-STAT TROPONIN, ED: Troponin i, poc: 0 ng/mL (ref 0.00–0.08)

## 2014-10-06 LAB — I-STAT CG4 LACTIC ACID, ED: Lactic Acid, Venous: 0.56 mmol/L (ref 0.5–2.2)

## 2014-10-06 MED ORDER — ONDANSETRON HCL 4 MG PO TABS: 4.0000 mg | ORAL_TABLET | Freq: Three times a day (TID) | ORAL | Status: DC | PRN

## 2014-10-06 MED ORDER — IBUPROFEN 200 MG PO TABS: 600.0000 mg | ORAL_TABLET | Freq: Three times a day (TID) | ORAL | Status: DC | PRN

## 2014-10-06 NOTE — ED Provider Notes (Signed)
CSN: [id number]     Arrival date & time 10/06/14  1108 History   First MD Initiated Contact with Patient 10/06/14 1243     Chief Complaint  Patient presents with  . Weakness   Patient is a 80 y.o. female presenting with weakness.  Weakness Associated symptoms include weakness.    Patient is an 80 y.o. Female who presents to the ED with her husband with no complaint.  Patient was normal last night and called her husband this morning and asked to be taken to the hospital and has not spoken since that time.  Patient has not been been responsive since she has been here in the ED.  Patient's husband feels that the patient is depressed secondary to not having enough children and being unhappy with her "life review".  Patient was normal yesterday evening and has been ambulating.  She has had no complaints per her husband.     Past Medical History  Diagnosis Date  . Osteoporosis    History reviewed. No pertinent past surgical history. History reviewed. No pertinent family history. History  Substance Use Topics  . Smoking status: Never Smoker   . Smokeless tobacco: Not on file  . Alcohol Use: No   OB History   Grav Para Term Preterm Abortions TAB SAB Ect Mult Living                 Review of Systems  Neurological: Positive for weakness.   Level 5 caveat   Allergies  Review of patient's allergies indicates no known allergies.  Home Medications   Prior to Admission medications   Medication Sig Start Date End Date Taking? Authorizing Provider  Calcium Carbonate-Vitamin D (CALTRATE 600+D PO) Take 1 tablet by mouth daily.   Yes Historical Provider, MD  desonide (DESOWEN) 0.05 % ointment Apply 1 application topically daily as needed (for skin on face).    Yes Historical Provider, MD  Multiple Vitamin (MULTIVITAMIN WITH MINERALS) TABS tablet Take 1 tablet by mouth daily.   Yes Historical Provider, MD   BP 118/56  Pulse 69  Temp(Src) 97.4 F (36.3 C) (Oral)  Resp 19  SpO2  93% Physical Exam  Nursing note and vitals reviewed. Constitutional: She appears well-developed and well-nourished. No distress.  HENT:  Head: Normocephalic and atraumatic.  Mouth/Throat: Oropharynx is clear and moist. No oropharyngeal exudate.  Eyes: Conjunctivae and EOM are normal. Pupils are equal, round, and reactive to light. No scleral icterus.  Neck: Normal range of motion. Neck supple. No JVD present. No thyromegaly present.  Cardiovascular: Normal rate, regular rhythm, normal heart sounds and intact distal pulses.  Exam reveals no gallop and no friction rub.   No murmur heard. Pulmonary/Chest: Effort normal and breath sounds normal. No respiratory distress. She has no wheezes. She has no rales. She exhibits no tenderness.  Abdominal: Soft. Bowel sounds are normal. She exhibits no distension and no mass. There is no tenderness. There is no rebound and no guarding.  Musculoskeletal:  Patient has no obvious musculoskeletal deformities.  Patient moves all extremities with stimulation.  Lymphadenopathy:    She has no cervical adenopathy.  Neurological:  Patient is non-communicative.  She moves all extremities with stimulation.  Patient is non-communicative.  Patient will intermittently follow commands.    Skin: Skin is warm and dry. She is not diaphoretic.    ED Course  Procedures (including critical care time) Labs Review Labs Reviewed  CBC WITH DIFFERENTIAL - Abnormal; Notable for the following:  WBC 3.5 (*)    Platelets 134 (*)    Neutrophils Relative % 30 (*)    Neutro Abs 1.1 (*)    Monocytes Relative 32 (*)    Monocytes Absolute 1.1 (*)    All other components within normal limits  COMPREHENSIVE METABOLIC PANEL - Abnormal; Notable for the following:    Albumin 3.3 (*)    GFR calc non Af Amer 85 (*)    All other components within normal limits  URINE CULTURE  URINALYSIS, ROUTINE W REFLEX MICROSCOPIC  I-STAT CG4 LACTIC ACID, ED  Rosezena SensorI-STAT TROPOININ, ED    Imaging  Review Dg Chest Portable 1 View  10/06/2014   CLINICAL DATA:  Altered mental status.  Unresponsive patient.  EXAM: PORTABLE CHEST - 1 VIEW  COMPARISON:  12/01/2009.  FINDINGS: Cardiac silhouette is mildly enlarged. No mediastinal or hilar masses. Stable apical scarring, right greater than left. Stable scarring or atelectasis at the left lung base. No lung consolidation or edema. Bony thorax is demineralized with changes from mid thoracic spine vertebroplasty.  IMPRESSION: No acute cardiopulmonary disease.   Electronically Signed   By: Amie Portlandavid  Ormond M.D.   On: 10/06/2014 14:25     EKG Interpretation   Date/Time:  Friday October 06 2014 13:58:36 EDT Ventricular Rate:  64 PR Interval:    QRS Duration: 83 QT Interval:  425 QTC Calculation: 438 R Axis:   100 Text Interpretation:  Normal Sinus rhythm Borderline Right axis deviation  which is new Low voltage, precordial leads Otherwise unchanged from prior  Confirmed by DOCHERTY  MD, MEGAN ([id number]) on 10/06/2014 2:01:50 PM      MDM   Final diagnoses:  Altered mental status   Patient is an 80 y.o. Female who presents to the ED with altered mental status.  Physical exam reveals non-responsive female who appears to be listening to conversations and will intermittently follow commands.  CT head is negative.  CXR chest is negative.  CBC, CMP, UA, lactic acid, and troponin are negative.  I have spoken with Dr. Cyril Mourningamillo who has recommended that the patient have an MRI of the brain to rule out frontal lobe stroke.  TTS has been consulted.  Patient has MRI pending.  Patient to be signed out with Landry Corporalatyana Kirenchenko PA-C at shift change.  Patient was seen by Dr. Micheline Mazeocherty who agrees with the above plan and workup.  If MRI is negative patient will need a psych consult.      Eben Burowourtney A Forcucci, PA-C 10/06/14 1655

## 2014-10-06 NOTE — ED Notes (Signed)
Family at bedside. 

## 2014-10-06 NOTE — ED Provider Notes (Signed)
6:15 PM Patient is back MRI, MRI is negative. She was signed out to me by morning shift team.  I spoke with patient and her husband. Her husband states that early this morning patient called him while he was at a store, and told him that she needed to come to the hospital. Patient would not tell him why. When she came to the hospital, she refused to speak or follow directions. Her exam otherwise was unremarkable to the prior team. Labs, CT scan all came back negative. Telephone neuro consult was obtained and MRI of the brain was recommended. Patient had MRI which like I stated earlier is negative. Patient's vital signs have been normal in emergency department.  When I spoke with patient, she is able to answer some questions. She states that she is hurting "all over." States that she is "tired and I feel miserable." She will not answer most of the questions. Patient did state a she's hungry, will try to feed. I also discussed patient with TTS, and able to tell a psych assessment.  Filed Vitals:   10/06/14 1109 10/06/14 1509 10/06/14 1731  BP: 124/61 118/56 114/54  Pulse: 64 69 74  Temp: 97.9 F (36.6 C) 97.4 F (36.3 C)   TempSrc: Oral Oral   Resp: 16 19 14   SpO2: 93% 93% 93%   8:35 PM Pt assessed by TTS, will keep until psychiatrist evaluation in AM. Pt sipped some water, refusing to eat. VS normal.   Filed Vitals:   10/06/14 1931  BP: 102/63  Pulse: 72  Temp: 97.9 F (36.6 C)  Resp: 18     Woodie Trusty A Elva Mauro, PA-C 10/06/14 2035

## 2014-10-06 NOTE — BH Assessment (Signed)
Assessment completed. Consulted with Nanine MeansJamison Lord, NP who recommended that pt be re-assessed in the morning. Tatyana A Kirichenko, PA-C has been informed of the recommendation.

## 2014-10-06 NOTE — ED Provider Notes (Signed)
Medical screening examination/treatment/procedure(s) were conducted as a shared visit with non-physician practitioner(s) and myself.  I personally evaluated the patient during the encounter.   EKG Interpretation   Date/Time:  Friday October 06 2014 13:58:36 EDT Ventricular Rate:  64 PR Interval:    QRS Duration: 83 QT Interval:  425 QTC Calculation: 438 R Axis:   100 Text Interpretation:  Normal Sinus rhythm Borderline Right axis deviation  which is new Low voltage, precordial leads Otherwise unchanged from prior  Confirmed by DOCHERTY  MD, MEGAN [phone]([id number]) on 10/06/2014 2:01:50 PM     The patient presents with her husband from home. He reports that she call him earlier today saying that she needed to go the hospital but would not further elaborate and has not spoken since. On my exam she refuses to speak other than yelling no when I sat her up in bed. She will actively resist me equally with both upper extremities. As well as both lower extremities. Her grimace is symmetric. She actively resist me equally with opening of both eyes. Her pupils are equal round and reactive to light. She has no signs of external trauma on exam. She will occasionally not her head yes and no. Plan to evaluate for acute medical cause of change in her behavior however workup normal we'll consider psychiatric evaluation she does appear to be listening and actively resisting on exam. She has no history of a prior episode in no recent illness or injury the husband is aware of.  CT head normal. Neurology recommended an MRI brain. If normal we will pursue such a psychiatric evaluation   Toy CookeyMegan Docherty, MD 10/06/14 1658

## 2014-10-06 NOTE — ED Notes (Signed)
Patient is not answering any questions or making any eye contact. Her spouse states that she called him on the phone while he was out for coffee and stated that she wanted to go to the hospital. She did not give him any explanation as to why she needed or wanted to come. Patient is not responding to her husband at all. He is here at the bedside trying to help.

## 2014-10-06 NOTE — ED Notes (Signed)
Patient will not answer any questions from nursing staff or her spouse. He states she is totally alert and oriented, but has chosen not to speak. He seems very frustrated and ask the patient did she want to go home and follow up with her PCP but she does not answer.

## 2014-10-06 NOTE — ED Notes (Signed)
Patient transported to MRI 

## 2014-10-06 NOTE — ED Notes (Signed)
Bed: [id number] Expected date:  Expected time:  Means of arrival:  Comments: EMS- elderly, increased weakness

## 2014-10-06 NOTE — ED Notes (Signed)
Food and drink offered and patient declined.

## 2014-10-06 NOTE — ED Notes (Signed)
Pt spoke with me, states that she does not want TTS consult (i informed her of this due to orienting pt to plan of care), then I asked pt what she did want and why she wanted to come to hospital and how we could help her and she said "they have all been asking but its late and I want you to turn the lights out".  Pt appears alert and oriented

## 2014-10-06 NOTE — ED Notes (Signed)
Food and drink offered. Patient refused.

## 2014-10-06 NOTE — BH Assessment (Signed)
Tele Assessment Note   Allison Callahan is an 80 y.o. female [facility] after requesting to come to the ED. Pt stated "I am weak". Pt also stated "I don't think you should be asking me questions". Pt husband reported that she has some medical issues and he believes that she may be depressed. He also reported that they have been doing some life reviews and pt was never able to have children.  He also reported that there is a home health aide that comes to visit every Friday and today when he went out for coffee his wife requested that he come home so that she can go to the ED. Pt's husband reported that she has lost two siblings within the past two years. When pt was asked about SI and HI she shook her head no but when asked about psychosis pt did not provide a response. Pt was very quiet throughout this assessment. Pt did not speak much throughout this assessment and maintained poor eye contact. It is recommended that pt be re-assess by psychiatry in the morning.   Axis I: Depressive Disorder NOS  Past Medical History:  Past Medical History  Diagnosis Date  . Osteoporosis     History reviewed. No pertinent past surgical history.  Family History: History reviewed. No pertinent family history.  Social History:  reports that she has never smoked. She does not have any smokeless tobacco history on file. She reports that she does not drink alcohol. Her drug history is not on file.  Additional Social History:  Alcohol / Drug Use History of alcohol / drug use?: No history of alcohol / drug abuse  CIWA: CIWA-Ar BP: 102/63 mmHg Pulse Rate: 72 COWS:    PATIENT STRENGTHS: (choose at least two) Average or above average intelligence Supportive family/friends  Allergies: No Known Allergies  Home Medications:  (Not in a hospital admission)  OB/GYN Status:  No LMP recorded. Patient is postmenopausal.  General Assessment Data Location of Assessment: [facility] ED Is this a Tele or  Face-to-Face Assessment?: Face-to-Face Is this an Initial Assessment or a Re-assessment for this encounter?: Initial Assessment Living Arrangements: Spouse/significant other Can pt return to current living arrangement?: Yes Admission Status: Voluntary Is patient capable of signing voluntary admission?: Yes Transfer from: Home Referral Source: Self/Family/Friend     [facility] Living Arrangements: Spouse/significant other Name of Psychiatrist: None reported Name of Therapist: None reported  Education Status Is patient currently in school?: No  Risk to self with the past 6 months Suicidal Ideation: No Suicidal Intent: No Is patient at risk for suicide?: No Suicidal Plan?: No Access to Means: No What has been your use of drugs/alcohol within the last 12 months?: No alcohol or drug use reported Previous Attempts/Gestures:  (Unable to assess.) How many times?:  (Unable to assess) Other Self Harm Risks:  (Unable to assess) Triggers for Past Attempts: None known Intentional Self Injurious Behavior:  (Unable to assess) Family Suicide History: Unable to assess Recent stressful life event(s):  (Unable to assess) Persecutory voices/beliefs?:  (Unable to assess) Depression:  (Unable to assess) Depression Symptoms:  (Unable to assess) Substance abuse history and/or treatment for substance abuse?: No Suicide prevention information given to non-admitted patients: Not applicable  Risk to Others within the past 6 months Homicidal Ideation: No Thoughts of Harm to Others: No Current Homicidal Intent: No Current Homicidal Plan: No Access to Homicidal Means: No Identified Victim: N/A History of harm to others?: No Assessment of Violence:  None Noted Violent Behavior Description: Pt is calm at this time. Does patient have access to weapons?: No Criminal Charges Pending?: No Does patient have a court date: No  Psychosis Hallucinations:  (Unable to assess) Delusions:  (Unable  to assess)  Mental Status Report Appear/Hygiene:  (Appropriate) Eye Contact: Poor Motor Activity: Unable to assess Speech: Soft (Minimal ) Level of Consciousness: Quiet/awake Mood: Euthymic Affect: Unable to Assess Anxiety Level: None Thought Processes: Unable to Assess Judgement: Unable to Assess Orientation: Unable to assess Obsessive Compulsive Thoughts/Behaviors: Unable to Assess  Cognitive Functioning Concentration: Unable to Assess Memory: Unable to Assess IQ: Average Insight: Unable to Assess Impulse Control: Unable to Assess Appetite: Poor Weight Loss:  (Unable to assess) Weight Gain:  (Unable to assess) Sleep: Unable to Assess Vegetative Symptoms: Unable to Assess     Prior Inpatient Therapy Prior Inpatient Therapy:  (Unable to assess)  Prior Outpatient Therapy Prior Outpatient Therapy:  (Unable to assess)          Abuse/Neglect Assessment (Assessment to be complete while patient is alone) Physical Abuse: Denies Verbal Abuse: Denies Sexual Abuse: Denies Exploitation of patient/patient's resources: Denies Self-Neglect: Denies Values / Beliefs Cultural Requests During Hospitalization: None Spiritual Requests During Hospitalization: None   Advance Directives (For Healthcare) Does patient have an advance directive?: No Would patient like information on creating an advanced directive?: No - patient declined information    Additional Information 1:1 In Past 12 Months?: No CIRT Risk: No Elopement Risk: No Does patient have medical clearance?: Yes     Disposition:  Disposition Initial Assessment Completed for this Encounter: Yes Disposition of Patient: Other dispositions Other disposition(s): Other (Comment) (Psychiatric consult in the morning. )  Tennyson Kallen S 10/06/2014 10:16 PM

## 2014-10-07 ENCOUNTER — Encounter (HOSPITAL_COMMUNITY): Payer: Self-pay | Admitting: Psychiatry

## 2014-10-07 DIAGNOSIS — F4321 Adjustment disorder with depressed mood: Secondary | ICD-10-CM

## 2014-10-07 LAB — URINE CULTURE
Colony Count: NO GROWTH
Culture: NO GROWTH
Special Requests: NORMAL

## 2014-10-07 NOTE — ED Notes (Signed)
Discussed with pt that she is needing o2 at this time to help with her low o2 saturation however she is refusing to use the o2 at this time. MD made aware. Will cont to monitor.

## 2014-10-07 NOTE — Consult Note (Signed)
[facility] Face-to-Face Psychiatry Consult   Reason for Consult:  Depression  Referring Physician:  EDP  Allison Callahan is an 80 y.o. female. Total Time spent with patient: 45 minutes  Assessment: AXIS I:  Adjustment Disorder with Depressed Mood AXIS II:  Deferred AXIS III:   Past Medical History  Diagnosis Date  . Osteoporosis    AXIS IV:  other psychosocial or environmental problems, problems related to social environment and chronic health problems AXIS V:  61-70 mild symptoms  Plan:  No evidence of imminent risk to self or others at present.  Dr. Adele Schilder assessed the patient and concurs with the plan.  Subjective:   Allison Callahan is a 80 y.o. female patient does not warrant admission.  HPI:  The patient's husband went to the store yesterday and she states she started feeling weaker (osteoporosis) and wanted to be evaluated in ED.  When she arrived, she evidently would not talk.  Today, she is talkative and states she was here for weakness evaluation.  Her husband who is a Social worker is at her bedside and stated yesterday they decided to hire a cook to help with their meals since he is a terrible cook and discussed him cutting back his extensive volunteer work.  He was concerned that she was depressed and she states she has some depression due to her confined state of osteoporosis but denies suicidal/homicidal ideations, hallucinations, and alcohol/drug abuse.  There are several stressors going on at home:  death of 2 brothers, sister and brother-in-law in the past two years, bats in the eaves outside of their home, new A/C/heat unit being put in next week, and their patio replaced due to all the rain.  EDP called to assess her weakness and counseling information given to the patient. HPI Elements:   Location:  generalized. Quality:  acute. Severity:  mild. Timing:  brief. Duration:  brief. Context:  stressors.  Past Psychiatric History: Past Medical History  Diagnosis Date  .  Osteoporosis     reports that she has never smoked. She does not have any smokeless tobacco history on file. She reports that she does not drink alcohol. Her drug history is not on file. History reviewed. No pertinent family history. Family History Substance Abuse:  (Unable to assess) Family Supports:  (Unable to assess) Living Arrangements: Spouse/significant other Can pt return to current living arrangement?: Yes Abuse/Neglect [facility]) Physical Abuse: Denies Verbal Abuse: Denies Sexual Abuse: Denies Allergies:  No Known Allergies  ACT Assessment Complete:  Yes:    Educational Status    Risk to Self: Risk to self with the past 6 months Suicidal Ideation: No Suicidal Intent: No Is patient at risk for suicide?: No Suicidal Plan?: No Access to Means: No What has been your use of drugs/alcohol within the last 12 months?: No alcohol or drug use reported Previous Attempts/Gestures:  (Unable to assess.) How many times?:  (Unable to assess) Other Self Harm Risks:  (Unable to assess) Triggers for Past Attempts: None known Intentional Self Injurious Behavior:  (Unable to assess) Family Suicide History: Unable to assess Recent stressful life event(s):  (Unable to assess) Persecutory voices/beliefs?:  (Unable to assess) Depression:  (Unable to assess) Depression Symptoms:  (Unable to assess) Substance abuse history and/or treatment for substance abuse?: No Suicide prevention information given to non-admitted patients: Not applicable  Risk to Others: Risk to Others within the past 6 months Homicidal Ideation: No Thoughts of Harm to Others: No Current Homicidal Intent: No Current Homicidal Plan:  No Access to Homicidal Means: No Identified Victim: N/A History of harm to others?: No Assessment of Violence: None Noted Violent Behavior Description: Pt is calm at this time. Does patient have access to weapons?: No Criminal Charges Pending?: No Does patient have a court date: No  Abuse:  Abuse/Neglect Assessment (Assessment to be complete while patient is alone) Physical Abuse: Denies Verbal Abuse: Denies Sexual Abuse: Denies Exploitation of patient/patient's resources: Denies Self-Neglect: Denies  Prior Inpatient Therapy: Prior Inpatient Therapy Prior Inpatient Therapy:  (Unable to assess)  Prior Outpatient Therapy: Prior Outpatient Therapy Prior Outpatient Therapy:  (Unable to assess)  Additional Information: Additional Information 1:1 In Past 12 Months?: No CIRT Risk: No Elopement Risk: No Does patient have medical clearance?: Yes                  Objective: Blood pressure 106/56, pulse 87, temperature 97.9 F (36.6 C), temperature source Axillary, resp. rate 16, SpO2 91.00%.There is no weight on file to calculate BMI. Results for orders placed during the hospital encounter of 10/06/14 (from the past 72 hour(s))  URINALYSIS, ROUTINE W REFLEX MICROSCOPIC     Status: None   Collection Time    10/06/14  2:10 PM      Result Value Ref Range   Color, Urine YELLOW  YELLOW   APPearance CLEAR  CLEAR   Specific Gravity, Urine 1.011  1.005 - 1.030   pH 7.5  5.0 - 8.0   Glucose, UA NEGATIVE  NEGATIVE mg/dL   Hgb urine dipstick NEGATIVE  NEGATIVE   Bilirubin Urine NEGATIVE  NEGATIVE   Ketones, ur NEGATIVE  NEGATIVE mg/dL   Protein, ur NEGATIVE  NEGATIVE mg/dL   Urobilinogen, UA 0.2  0.0 - 1.0 mg/dL   Nitrite NEGATIVE  NEGATIVE   Leukocytes, UA NEGATIVE  NEGATIVE   Comment: MICROSCOPIC NOT DONE ON URINES WITH NEGATIVE PROTEIN, BLOOD, LEUKOCYTES, NITRITE, OR GLUCOSE <1000 mg/dL.  CBC WITH DIFFERENTIAL     Status: Abnormal   Collection Time    10/06/14  2:49 PM      Result Value Ref Range   WBC 3.5 (*) 4.0 - 10.5 K/uL   RBC 4.37  3.87 - 5.11 MIL/uL   Hemoglobin 13.2  12.0 - 15.0 g/dL   HCT 38.4  36.0 - 46.0 %   MCV 87.9  78.0 - 100.0 fL   MCH 30.2  26.0 - 34.0 pg   MCHC 34.4  30.0 - 36.0 g/dL   RDW 12.4  11.5 - 15.5 %   Platelets 134 (*) 150 - 400  K/uL   Neutrophils Relative % 30 (*) 43 - 77 %   Neutro Abs 1.1 (*) 1.7 - 7.7 K/uL   Lymphocytes Relative 37  12 - 46 %   Lymphs Abs 1.3  0.7 - 4.0 K/uL   Monocytes Relative 32 (*) 3 - 12 %   Monocytes Absolute 1.1 (*) 0.1 - 1.0 K/uL   Eosinophils Relative 1  0 - 5 %   Eosinophils Absolute 0.0  0.0 - 0.7 K/uL   Basophils Relative 0  0 - 1 %   Basophils Absolute 0.0  0.0 - 0.1 K/uL  COMPREHENSIVE METABOLIC PANEL     Status: Abnormal   Collection Time    10/06/14  2:49 PM      Result Value Ref Range   Sodium 137  137 - 147 mEq/L   Potassium 4.0  3.7 - 5.3 mEq/L   Chloride 97  96 - 112 mEq/L  CO2 29  19 - 32 mEq/L   Glucose, Bld 81  70 - 99 mg/dL   BUN 17  6 - 23 mg/dL   Creatinine, Ser 0.55  0.50 - 1.10 mg/dL   Calcium 8.9  8.4 - 10.5 mg/dL   Total Protein 6.2  6.0 - 8.3 g/dL   Albumin 3.3 (*) 3.5 - 5.2 g/dL   AST 27  0 - 37 U/L   ALT 15  0 - 35 U/L   Alkaline Phosphatase 100  39 - 117 U/L   Total Bilirubin 0.5  0.3 - 1.2 mg/dL   GFR calc non Af Amer 85 (*) >90 mL/min   GFR calc Af Amer >90  >90 mL/min   Comment: (NOTE)     The eGFR has been calculated using the CKD EPI equation.     This calculation has not been validated in all clinical situations.     eGFR's persistently <90 mL/min signify possible Chronic Kidney     Disease.   Anion gap 11  5 - 15  I-STAT TROPOININ, ED     Status: None   Collection Time    10/06/14  3:00 PM      Result Value Ref Range   Troponin i, poc 0.00  0.00 - 0.08 ng/mL   Comment 3            Comment: Due to the release kinetics of cTnI,     a negative result within the first hours     of the onset of symptoms does not rule out     myocardial infarction with certainty.     If myocardial infarction is still suspected,     repeat the test at appropriate intervals.  I-STAT CG4 LACTIC ACID, ED     Status: None   Collection Time    10/06/14  3:02 PM      Result Value Ref Range   Lactic Acid, Venous 0.56  0.5 - 2.2 mmol/L   Labs are reviewed  and are pertinent for no medical issues.  Current Facility-Administered Medications  Medication Dose Route Frequency Provider Last Rate Last Dose  . ibuprofen (ADVIL,MOTRIN) tablet 600 mg  600 mg Oral Q8H PRN Tatyana A Kirichenko, PA-C      . ondansetron (ZOFRAN) tablet 4 mg  4 mg Oral Q8H PRN Tatyana A Kirichenko, PA-C       Current Outpatient Prescriptions  Medication Sig Dispense Refill  . Calcium Carbonate-Vitamin D (CALTRATE 600+D PO) Take 1 tablet by mouth daily.      Marland Kitchen desonide (DESOWEN) 0.05 % ointment Apply 1 application topically daily as needed (for skin on face).       . Multiple Vitamin (MULTIVITAMIN WITH MINERALS) TABS tablet Take 1 tablet by mouth daily.        Psychiatric Specialty Exam:     Blood pressure 106/56, pulse 87, temperature 97.9 F (36.6 C), temperature source Axillary, resp. rate 16, SpO2 91.00%.There is no weight on file to calculate BMI.  General Appearance: Casual  Eye Contact::  Good  Speech:  Normal Rate  Volume:  Normal  Mood:  Euthymic  Affect:  Congruent  Thought Process:  Coherent  Orientation:  Full (Time, Place, and Person)  Thought Content:  Delusions  Suicidal Thoughts:  No  Homicidal Thoughts:  No  Memory:  Immediate;   Good Recent;   Good Remote;   Good  Judgement:  Good  Insight:  Fair  Psychomotor Activity:  Normal  Concentration:  Good  Recall:  Good  Fund of Knowledge:Good  Language: Good  Akathisia:  No  Handed:  Right  AIMS (if indicated):     Assets:  Communication Skills Desire for Improvement Financial Resources/Insurance Housing Intimacy Leisure Time Resilience Social Support Talents/Skills Transportation  Sleep:      Musculoskeletal: Strength & Muscle Tone: within normal limits Gait & Station: normal Patient leans: N/A  Treatment Plan Summary: Discharge from psychiatric evaluation results, resources given for counseling.  Waylan Boga, [place] 10/07/2014 12:27 PM  I have personally seen the  patient and agreed with the findings and involved in the treatment plan. Berniece Andreas, MD

## 2014-10-07 NOTE — Discharge Instructions (Signed)

## 2014-10-07 NOTE — ED Notes (Addendum)
1 pt's belongings bag placed in locker # 28

## 2014-10-07 NOTE — ED Notes (Addendum)
Pt is still refusing to leave, she claims she hit her head on the computer while getting dressed, however her clothes are still at same place on the bed, untouched. Pt assessed, there are no obvious marks where she pointed on her head, no bleeding, no bruising, no swelling. When she was reminded again that she is discharged and needs to leave, pt gets very agitated stating "I can hear don't repeat things to me. Motels and hospitals have plenty of rooms available."

## 2014-10-07 NOTE — [facility] Suicide Risk Assessment (Signed)
Suicide Risk Assessment  Discharge Assessment     Demographic Factors:  Age 80 or older and Caucasian  Total Time spent with patient: 45 minutes   Psychiatric Specialty Exam:     Blood pressure 106/56, pulse 87, temperature 97.9 F (36.6 C), temperature source Axillary, resp. rate 16, SpO2 91.00%.There is no weight on file to calculate BMI.  General Appearance: Casual  Eye Contact::  Good  Speech:  Normal Rate  Volume:  Normal  Mood:  Euthymic  Affect:  Congruent  Thought Process:  Coherent  Orientation:  Full (Time, Place, and Person)  Thought Content:  Delusions  Suicidal Thoughts:  No  Homicidal Thoughts:  No  Memory:  Immediate;   Good Recent;   Good Remote;   Good  Judgement:  Good  Insight:  Fair  Psychomotor Activity:  Normal  Concentration:  Good  Recall:  Good  Fund of Knowledge:Good  Language: Good  Akathisia:  No  Handed:  Right  AIMS (if indicated):     Assets:  Communication Skills Desire for Improvement Financial Resources/Insurance Housing Intimacy Leisure Time Resilience Social Support Talents/Skills Transportation  Sleep:      Musculoskeletal: Strength & Muscle Tone: within normal limits Gait & Station: normal Patient leans: N/A  Mental Status Per Nursing Assessment::   On Admission:   weakness  Current Mental Status by Physician: NA  Loss Factors: NA  Historical Factors: NA  Risk Reduction Factors:   Sense of responsibility to family, Religious beliefs about death, Living with another person, especially a relative, Positive social support and Positive coping skills or problem solving skills  Continued Clinical Symptoms:  Mild depression  Cognitive Features That Contribute To Risk:  None   Suicide Risk:  Minimal: No identifiable suicidal ideation.  Patients presenting with no risk factors but with morbid ruminations; may be classified as minimal risk based on the severity of the depressive symptoms  Discharge  Diagnoses:   AXIS I:  Adjustment Disorder with Depressed Mood AXIS II:  Deferred AXIS III:   Past Medical History  Diagnosis Date  . Osteoporosis    AXIS IV:  other psychosocial or environmental problems, problems related to social environment and chronic physical issues AXIS V:  61-70 mild symptoms  Plan Of Care/Follow-up recommendations:  Activity:  as tolerated Diet:  heart healthy diet  Is patient on multiple antipsychotic therapies at discharge:  No   Has Patient had three or more failed trials of antipsychotic monotherapy by history:  No  Recommended Plan for Multiple Antipsychotic Therapies: NA    LORD, JAMISON, PMH-NP 10/07/2014, 12:39 PM

## 2014-10-07 NOTE — ED Provider Notes (Signed)
Results for orders placed during the hospital encounter of 10/06/14  CBC WITH DIFFERENTIAL      Result Value Ref Range   WBC 3.5 (*) 4.0 - 10.5 K/uL   RBC 4.37  3.87 - 5.11 MIL/uL   Hemoglobin 13.2  12.0 - 15.0 g/dL   HCT 78.238.4  95.636.0 - 21.346.0 %   MCV 87.9  78.0 - 100.0 fL   MCH 30.2  26.0 - 34.0 pg   MCHC 34.4  30.0 - 36.0 g/dL   RDW 08.612.4  57.811.5 - 46.915.5 %   Platelets 134 (*) 150 - 400 K/uL   Neutrophils Relative % 30 (*) 43 - 77 %   Neutro Abs 1.1 (*) 1.7 - 7.7 K/uL   Lymphocytes Relative 37  12 - 46 %   Lymphs Abs 1.3  0.7 - 4.0 K/uL   Monocytes Relative 32 (*) 3 - 12 %   Monocytes Absolute 1.1 (*) 0.1 - 1.0 K/uL   Eosinophils Relative 1  0 - 5 %   Eosinophils Absolute 0.0  0.0 - 0.7 K/uL   Basophils Relative 0  0 - 1 %   Basophils Absolute 0.0  0.0 - 0.1 K/uL  COMPREHENSIVE METABOLIC PANEL      Result Value Ref Range   Sodium 137  137 - 147 mEq/L   Potassium 4.0  3.7 - 5.3 mEq/L   Chloride 97  96 - 112 mEq/L   CO2 29  19 - 32 mEq/L   Glucose, Bld 81  70 - 99 mg/dL   BUN 17  6 - 23 mg/dL   Creatinine, Ser 6.290.55  0.50 - 1.10 mg/dL   Calcium 8.9  8.4 - 52.810.5 mg/dL   Total Protein 6.2  6.0 - 8.3 g/dL   Albumin 3.3 (*) 3.5 - 5.2 g/dL   AST 27  0 - 37 U/L   ALT 15  0 - 35 U/L   Alkaline Phosphatase 100  39 - 117 U/L   Total Bilirubin 0.5  0.3 - 1.2 mg/dL   GFR calc non Af Amer 85 (*) >90 mL/min   GFR calc Af Amer >90  >90 mL/min   Anion gap 11  5 - 15  URINALYSIS, ROUTINE W REFLEX MICROSCOPIC      Result Value Ref Range   Color, Urine YELLOW  YELLOW   APPearance CLEAR  CLEAR   Specific Gravity, Urine 1.011  1.005 - 1.030   pH 7.5  5.0 - 8.0   Glucose, UA NEGATIVE  NEGATIVE mg/dL   Hgb urine dipstick NEGATIVE  NEGATIVE   Bilirubin Urine NEGATIVE  NEGATIVE   Ketones, ur NEGATIVE  NEGATIVE mg/dL   Protein, ur NEGATIVE  NEGATIVE mg/dL   Urobilinogen, UA 0.2  0.0 - 1.0 mg/dL   Nitrite NEGATIVE  NEGATIVE   Leukocytes, UA NEGATIVE  NEGATIVE  I-STAT CG4 LACTIC ACID, ED   Result Value Ref Range   Lactic Acid, Venous 0.56  0.5 - 2.2 mmol/L  I-STAT TROPOININ, ED      Result Value Ref Range   Troponin i, poc 0.00  0.00 - 0.08 ng/mL   Comment 3            Ct Head Wo Contrast  10/06/2014   CLINICAL DATA:  80 year old with weakness and altered mental status.  EXAM: CT HEAD WITHOUT CONTRAST  TECHNIQUE: Contiguous axial images were obtained from the base of the skull through the vertex without contrast.  COMPARISON:  None  FINDINGS: No evidence for  acute hemorrhage, mass lesion, midline shift, hydrocephalus or large infarct. Visualized sinuses are clear. Negative for a calvarial fracture.  IMPRESSION: No acute intracranial abnormality.   Electronically Signed   By: Richarda OverlieAdam  Henn M.D.   On: 10/06/2014 16:28   Mr Brain Wo Contrast  10/06/2014   CLINICAL DATA:  Altered mental status.  EXAM: MRI HEAD WITHOUT CONTRAST  TECHNIQUE: Multiplanar, multiecho pulse sequences of the brain and surrounding structures were obtained without intravenous contrast.  COMPARISON:  CT head from the same day.  FINDINGS: No acute infarct, hemorrhage, or mass lesion is present. Mild atrophy and minimal white matter changes bilaterally are likely within normal limits for age. The ventricles are proportionate to the degree of atrophy. No significant extraaxial fluid collection is present.  Flow is present in the major intracranial arteries. The globes and orbits are intact. The paranasal sinuses and mastoid air cells are clear.  IMPRESSION: 1. Normal MRI appearance of the brain for age.   Electronically Signed   By: Gennette Pachris  Mattern M.D.   On: 10/06/2014 17:39   Dg Chest Portable 1 View  10/06/2014   CLINICAL DATA:  Altered mental status.  Unresponsive patient.  EXAM: PORTABLE CHEST - 1 VIEW  COMPARISON:  12/01/2009.  FINDINGS: Cardiac silhouette is mildly enlarged. No mediastinal or hilar masses. Stable apical scarring, right greater than left. Stable scarring or atelectasis at the left lung base. No lung  consolidation or edema. Bony thorax is demineralized with changes from mid thoracic spine vertebroplasty.  IMPRESSION: No acute cardiopulmonary disease.   Electronically Signed   By: Amie Portlandavid  Ormond M.D.   On: 10/06/2014 14:25    Pt has been cleared by psych.  No medical reason for admission.  Pt alert, oriented, eating, will d/c home with husband who is comfortable taking pt home.  Will f/u with PMD  Rolan BuccoMelanie Hymen Arnett, MD 10/07/14 1416

## 2014-10-07 NOTE — ED Notes (Signed)
After pt's husband left room pt refused to get dressed, she appears upset and angry stating" I am going to wait for an hour before I go anywhere". Explained to pt she was discharged and her husband was bringing car closer to entrance, she then sts: "And was it that going to change, he wont do anything, he will not cook or clean. I came to hospital to get some rest, and now everything will be just the same. Tell him to come back here and carry this for me (her bag), and do his job, or I am not going anywhere." Pt's husband returned to room to help pt get ready for discharge.

## 2014-10-08 NOTE — ED Provider Notes (Signed)
Medical screening examination/treatment/procedure(s) were performed by non-physician practitioner and as supervising physician I was immediately available for consultation/collaboration.  Audree CamelScott T Nicha Hemann, MD 10/08/14 2322

## 2015-04-23 ENCOUNTER — Encounter: Payer: Self-pay | Admitting: Family Medicine

## 2015-04-23 ENCOUNTER — Ambulatory Visit (INDEPENDENT_AMBULATORY_CARE_PROVIDER_SITE_OTHER): Payer: Self-pay | Admitting: Family Medicine

## 2015-04-23 DIAGNOSIS — R69 Illness, unspecified: Secondary | ICD-10-CM

## 2015-04-23 NOTE — Progress Notes (Signed)
NO SHOW

## 2015-07-24 ENCOUNTER — Ambulatory Visit: Payer: Self-pay | Admitting: Family Medicine

## 2015-08-13 ENCOUNTER — Ambulatory Visit: Payer: Self-pay | Admitting: Family Medicine

## 2015-08-21 ENCOUNTER — Ambulatory Visit: Payer: Self-pay | Admitting: Family Medicine

## 2015-10-01 ENCOUNTER — Other Ambulatory Visit: Payer: Self-pay | Admitting: *Deleted

## 2015-10-01 ENCOUNTER — Encounter: Payer: Self-pay | Admitting: *Deleted

## 2015-10-01 NOTE — Patient Outreach (Signed)
New Referral to me from Dr. Samul DadaMcGowen's office. Pt has difficult time coming to MD office. I spoke with pt's husband, who is her HCPOA. I advised him of the referral and told him about our [facility]. He would like to have more information and I have agreed to send it to him. He took my name and number and said he would be in touch with me. I will advise Dr. Samul DadaMcGowen's office that I have been in contact.  Almetta LovelyCarroll Adnan Vanvoorhis [facility]-BC [facility] Care Manager [phone]

## 2015-10-25 ENCOUNTER — Other Ambulatory Visit: Payer: Self-pay | Admitting: *Deleted

## 2015-10-25 NOTE — Patient Outreach (Signed)
Follow up call to discuss [facility]. I spoke to Allison Callahan today and she informed me that they had too many things going on in their life to consent to our services. I asked if she would please tell Allison Callahan that I had called and she said that she would, although I do not know if this will take place.  I had previously spoken with pt husband who had asked for our written information which was mailed. He has my phone number if in the future he or she decides that they would like our services.  Allison Callahan [facility] Care Manager [phone]

## 2016-05-20 ENCOUNTER — Emergency Department (HOSPITAL_COMMUNITY)
Admission: EM | Admit: 2016-05-20 | Discharge: 2016-05-21 | Disposition: A | Discharge status: Home / Self Care | Payer: Medicare Other | Attending: Emergency Medicine | Admitting: Emergency Medicine

## 2016-05-20 ENCOUNTER — Encounter (HOSPITAL_COMMUNITY): Payer: Self-pay | Admitting: *Deleted

## 2016-05-20 DIAGNOSIS — F202 Catatonic schizophrenia: Secondary | ICD-10-CM | POA: Insufficient documentation

## 2016-05-20 DIAGNOSIS — F4321 Adjustment disorder with depressed mood: Secondary | ICD-10-CM

## 2016-05-20 DIAGNOSIS — R4182 Altered mental status, unspecified: Secondary | ICD-10-CM | POA: Diagnosis present

## 2016-05-20 DIAGNOSIS — M810 Age-related osteoporosis without current pathological fracture: Secondary | ICD-10-CM | POA: Diagnosis not present

## 2016-05-20 DIAGNOSIS — F061 Catatonic disorder due to known physiological condition: Secondary | ICD-10-CM

## 2016-05-20 LAB — COMPREHENSIVE METABOLIC PANEL
ALBUMIN: 4 g/dL (ref 3.5–5.0)
ALK PHOS: 107 U/L (ref 38–126)
ALT: 17 U/L (ref 14–54)
ANION GAP: 6 (ref 5–15)
AST: 28 U/L (ref 15–41)
BILIRUBIN TOTAL: 0.9 mg/dL (ref 0.3–1.2)
BUN: 17 mg/dL (ref 6–20)
CALCIUM: 8.9 mg/dL (ref 8.9–10.3)
CO2: 31 mmol/L (ref 22–32)
CREATININE: 0.46 mg/dL (ref 0.44–1.00)
Chloride: 102 mmol/L (ref 101–111)
GFR calc non Af Amer: >60 mL/min (ref >60)
GLUCOSE: 82 mg/dL (ref 65–99)
Potassium: 3.8 mmol/L (ref 3.5–5.1)
SODIUM: 139 mmol/L (ref 135–145)
TOTAL PROTEIN: 6.8 g/dL (ref 6.5–8.1)

## 2016-05-20 LAB — CBC
HCT: 42 % (ref 36.0–46.0)
Hemoglobin: 13.9 g/dL (ref 12.0–15.0)
MCH: 29.6 pg (ref 26.0–34.0)
MCHC: 33.1 g/dL (ref 30.0–36.0)
MCV: 89.6 fL (ref 78.0–100.0)
PLATELETS: 128 10*3/uL — AB (ref 150–400)
RBC: 4.69 MIL/uL (ref 3.87–5.11)
RDW: 12.3 % (ref 11.5–15.5)
WBC: 4.5 10*3/uL (ref 4.0–10.5)

## 2016-05-20 LAB — URINALYSIS, ROUTINE W REFLEX MICROSCOPIC
BILIRUBIN URINE: NEGATIVE
GLUCOSE, UA: NEGATIVE mg/dL
HGB URINE DIPSTICK: NEGATIVE
KETONES UR: 15 mg/dL — AB
Nitrite: NEGATIVE
PROTEIN: NEGATIVE mg/dL
Specific Gravity, Urine: 1.021 (ref 1.005–1.030)
pH: 8 (ref 5.0–8.0)

## 2016-05-20 LAB — URINE MICROSCOPIC-ADD ON

## 2016-05-20 LAB — CBG MONITORING, ED: GLUCOSE-CAPILLARY: 73 mg/dL (ref 65–99)

## 2016-05-20 NOTE — BH Assessment (Signed)
[facility] Assessment Progress Note   Case was staffed with Lord DNP who recommended patient be observed and re-evaluated in the a.m   

## 2016-05-20 NOTE — ED Notes (Signed)
Dr @ bedside

## 2016-05-20 NOTE — BH Assessment (Addendum)
Assessment Note  Allison Callahan is an 82 y.o. female. Patient refused assessment. Patient would not open eyes and declined to participate in the assessment. Assessment was completed by utilizing collateral information from husband who was present Allison SabinSam Callahan. Patient's husband reported that wife has not been eating for the last 24 hours. Husband believes that wife is trying to harm herself by refusing to eat. Patient [facility] when she was diagnosed with an adjustment D/O with depression. Notes obtained on admission stated, "Per EMS, pt from home, pt's husband reports pt is refusing to eat, drink, take her meds or open her eyes. Denies hx of dementia. Pt is alert, but refuses to open her eyes. Case was staffed with [name] who recommended patient be observed and re-evaluated in the a.m.   Diagnosis: Adjustment D/O with depression, severe  Past Medical History:  Past Medical History  Diagnosis Date  . Osteoporosis   . CLOSTRIDIUM DIFFICILE COLITIS 09/09/2007    Qualifier: Diagnosis of  By: [name], [name]   . PERIPHERAL NEUROPATHY 08/08/2008    Qualifier: Diagnosis of  By: [name], [name]   . VERTEBRAL FRACTURE, THORACIC SPINE 12/29/2007    Qualifier: Diagnosis of  By: [name], [name]   . FRACTURE, WRIST 09/06/2007    Qualifier: Diagnosis of  By: [name], [name]   . Adult failure to thrive 08/08/2008    Qualifier: Diagnosis of  By: [name], [name]     History reviewed. No pertinent past surgical history.  Family History: No family history on file.  Social History:  reports that she has never smoked. She does not have any smokeless tobacco history on file. She reports that she does not drink alcohol. Her drug history is not on file.  Additional Social History:  Alcohol / Drug Use Pain Medications: See MAR Prescriptions: See MAR Over the Counter: See MAR History of alcohol / drug use?: No history of alcohol / drug abuse  CIWA:  CIWA-Ar BP: 126/62 mmHg Pulse Rate: (!) 58 COWS:    Allergies: No Known Allergies  Home Medications:  (Not in a hospital admission)  OB/GYN Status:  No LMP recorded. Patient is postmenopausal.  General Assessment Data [facility] of [facility]: [facility] TTS Assessment: In system Is this a Tele or Face-to-Face Assessment?: Face-to-Face Is this an Initial Assessment or a Re-assessment for this encounter?: Initial Assessment Marital status: Married ReginaMaiden name: na Is patient pregnant?: No Pregnancy Status: No Living Arrangements: Spouse/significant other Can pt return to current living arrangement?: Yes Admission Status: Voluntary Is patient capable of signing voluntary admission?: Yes Referral Source: Self/Family/Friend Insurance type: Medicare  Medical Screening Exam [facility] Walk-[facility] ONLY) Medical Exam completed: Yes  Crisis Care Plan Living Arrangements: Spouse/significant other Legal Guardian: Other: (none) Name of Psychiatrist: none Name of Therapist: none  Education Status Is patient currently in school?: No Current Grade: na Highest grade of school patient has completed: 12 Name of school: na Contact person: na  Risk to self with the past 6 months Suicidal Ideation: No Has patient been a risk to self within the past 6 months prior to admission? : No Suicidal Intent: No Has patient had any suicidal intent within the past 6 months prior to admission? : No Is patient at risk for suicide?: No Suicidal Plan?: No Has patient had any suicidal plan within the past 6 months prior to admission? : No Access to Means: No What has been  your use of drugs/alcohol within the last 12 months?: Denies Previous Attempts/Gestures: No How many times?: 0 Other Self Harm Risks: None Triggers for Past Attempts:  (NA) Intentional Self Injurious Behavior: None Family Suicide History: No Recent stressful life event(s): Other (Comment) (aging issues) Persecutory voices/beliefs?:  No Depression:  (per husband pt UTA) Depression Symptoms:  (UTA) Substance abuse history and/or treatment for substance abuse?: No Suicide prevention information given to non-admitted patients: Not applicable  Risk to Others within the past 6 months Homicidal Ideation: No Does patient have any lifetime risk of violence toward others beyond the six months prior to admission? : No Thoughts of Harm to Others: No Current Homicidal Intent: No Current Homicidal Plan: No Access to Homicidal Means: No Identified Victim: na History of harm to others?: No Assessment of Violence: None Noted Violent Behavior Description: na Does patient have access to weapons?: No Criminal Charges Pending?: No Does patient have a court date: No Is patient on probation?: No  Psychosis Hallucinations: None noted Delusions: None noted  Mental Status Report Appearance/Hygiene: In scrubs Eye Contact: Unable to Assess Motor Activity: Unable to assess Speech: Unable to assess Level of Consciousness: Unable to assess Mood:  (UTA) Affect: Unable to Assess Anxiety Level:  (UTA) Thought Processes: Unable to Assess Judgement: Unable to Assess Orientation: Unable to assess Obsessive Compulsive Thoughts/Behaviors: Unable to Assess  Cognitive Functioning Concentration: Unable to Assess Memory: Unable to Assess IQ:  (UTA) Insight: Unable to Assess Impulse Control: Unable to Assess Appetite: Poor (per husband) Weight Loss:  (unknown) Weight Gain: 0 Sleep: Increased (per husband) Total Hours of Sleep: 10 Vegetative Symptoms: Staying in bed  ADLScreening [facility] Assessment Services) Patient's cognitive ability adequate to safely complete daily activities?:  (UTA) Patient able to express need for assistance with ADLs?: Yes (per husband) Independently performs ADLs?: No  Prior Inpatient Therapy Prior Inpatient Therapy: No Prior Therapy Dates: na Prior [facility]Provider(s): [name] Reason for Treatment:  Adjustment D/O  Prior Outpatient Therapy Prior Outpatient Therapy: No Prior Therapy Dates: 2017 Prior Therapy Facilty/Provider(s): none noted Reason for Treatment: Depression Does patient have an ACCT team?: No Does patient have Intensive In-House Services?  : No Does patient have [facility] services? : No Does patient have P4CC services?: No  ADL Screening (condition at time of admission) Patient's cognitive ability adequate to safely complete daily activities?:  (UTA) Is the patient deaf or have difficulty hearing?: Yes (per husband present) Does the patient have difficulty seeing, even when wearing glasses/contacts?: Yes (per husband who was present) Does the patient have difficulty concentrating, remembering, or making decisions?:  (UTA) Patient able to express need for assistance with ADLs?: Yes (per husband) Does the patient have difficulty dressing or bathing?: Yes Independently performs ADLs?: No Dressing (OT): Needs assistance Is this a change from baseline?: Pre-admission baseline Bathing: Needs assistance Is this a change from baseline?: Pre-admission baseline Toileting: Needs assistance Is this a change from baseline?: Pre-admission baseline In/Out Bed: Needs assistance Is this a change from baseline?: Pre-admission baseline Walks in Home: Needs assistance Is this a change from baseline?: Pre-admission baseline Does the patient have difficulty walking or climbing stairs?: Yes Weakness of Legs: Both Weakness of Arms/Hands: Both  Home Assistive Devices/Equipment Home Assistive Devices/Equipment: None (husband assists)  Therapy Consults (therapy consults require a physician order) PT Evaluation Needed: No OT Evalulation Needed: No SLP Evaluation Needed: No Abuse/Neglect Assessment (Assessment to be complete while patient is alone) Physical Abuse: Denies Verbal Abuse: Denies Sexual Abuse: Denies Exploitation of patient/patient's resources:  Denies Self-Neglect:  Denies Values / Beliefs Cultural Requests During Hospitalization: None Spiritual Requests During Hospitalization: None Consults Spiritual Care Consult Needed: No Social Work Consult Needed: No Merchant navy officer (For Healthcare) Does patient have an advance directive?: No Would patient like information on creating an advanced directive?: No - patient declined information (pt declined )    Additional Information 1:1 In Past 12 Months?: No CIRT Risk: No Elopement Risk: No Does patient have medical clearance?: Yes     Disposition: Case was staffed with Shaune Pollack DNP who recommended patient be observed and re-evaluated in the a.m.  Disposition Initial Assessment Completed for this Encounter: Yes Disposition of Patient: Other dispositions Other disposition(s): Other (Comment) (patient will be re-evaluated in the a.m.)  On Site Evaluation by:   Reviewed with Physician:    Alfredia Ferguson 05/20/2016 6:09 PM

## 2016-05-20 NOTE — ED Notes (Signed)
Per EMS, pt from home, pt's husband reports pt is refusing to eat, drink, take her meds or open her eyes.  Denies hx of dementia. Pt is alert, but refuses to open her eyes.

## 2016-05-21 ENCOUNTER — Telehealth: Payer: Self-pay | Admitting: Internal Medicine

## 2016-05-21 DIAGNOSIS — F4321 Adjustment disorder with depressed mood: Secondary | ICD-10-CM | POA: Diagnosis not present

## 2016-05-21 MED ORDER — CITALOPRAM HYDROBROMIDE 10 MG PO TABS
10.0000 mg | ORAL_TABLET | Freq: Every day | ORAL | Status: DC
  Filled 2016-05-21: qty 1

## 2016-05-21 MED ORDER — CITALOPRAM HYDROBROMIDE 10 MG PO TABS: 10.0000 mg | ORAL_TABLET | Freq: Every day | ORAL | Status: AC

## 2016-05-21 NOTE — ED Notes (Signed)
MD made aware of pts blood pressure. Will continue to monitor.

## 2016-05-21 NOTE — Discharge Instructions (Signed)
For your ongoing behavioral health needs, you are advised to follow up with an outpatient psychiatrist.  Archer AsaGerald Plovsky, MD, specializes in providing these services for older adults.  He practices at two local outpatient clinics.  Call them at your earliest opportunity to ask about scheduling an intake appointment:       [facility]      [address]      [place], [place] [address]      [phone]       [facility]      [address]      [place], [place] [address]      [phone]

## 2016-05-21 NOTE — BH Assessment (Signed)
[facility] Assessment Progress Note  Per Thedore MinsMojeed Akintayo, MD, this pt does not require psychiatric hospitalization at this time.  Pt is to be discharged from Allison Callahan with outpatient referrals.  Allison MeansJamison Lord, DNP, has spoken to the pt and determined that she would prefer to call a provider at her own initiative to schedule an appointment.  Referral information for Allison AsaGerald Plovsky, MD, has been included in pt's discharge instructions.  Pt's nurse has been notified.  Allison Canninghomas Adean Milosevic, MA Triage Specialist [phone]

## 2016-05-21 NOTE — Progress Notes (Addendum)
ED CM consulted by [place] at 1105 to assist with increasing home services for pt Confirms pt is being seen now for 1 1/2 hours by Hilaria Ota once a week.  ED CM met with husband and discussed CM consult, services and options of home care in details CM reviewed in details choice connections, medicare guidelines, Choices of home health [facility]) (length of stay in home, types of [facility] staff available, coverage, primary caregiver, up to 24 hrs before services may be started) and choices of Private duty nursing (PDN-coverage, length of stay in the home types of staff available). CM provided pt/family with a list of [place]. Choice connection pamphlet and PDN.   Husband noted in interaction to repeat subjects to CM and needing time to recall subjects He redirects himself back on topic Husband, pt's primary caregiver, noted with notes to jare his memory He confirms with CM that the pt has lots of anxiety (when CM met with her she was calm and discussed she was sleepy) Husband states the [facility] aide for this week was cancelled and may need to be rescheduled [employer] aide was assisting with ADLs and light housekeeping  Husband is familiar with [facility] resp[facility] care and will like to discuss the services with pt for future use but not presently  Husband is familiar with MDs that visit in the home but is not wanting to start using these services Husband states because of coverage reasons and "they just take over the records"  Husband is offering his pcp Dr Garret Reddish [phone] who he states has agreed to see pt Husband states pt has anxiety and had a 'bad experience with her doctor" referring to Dr Cathlean Cower and  Husband provided Cm with [facility] regional director business card, Weston Settle  118 ED Cm informed ED NT pt interested in lunch 1220 updated EDP 1221 Referral to [place] coordinator Discussed pt anxiety starting services slow, Added [facility] after recommendation of  Ed[place], and discussed pt pcp concerns - anxiety with pcp but also  [place] called CM to discuss pt being no shows for appts (as seen in [facility]) and last seen by Dr Jenny Reichmann in May 2016  1237 ED CM called [phone] dr Jenny Reichmann office and spoke with Tammy who confirms pt not seen since 2009 and notes that pt made 2016 appt x 3 with Joseph Art, NP and cancelled --also made an appt at Dr Maudie Mercury office and cancelled appt -- Tammy confirms Dr Jenny Reichmann may not be able to continue to see pt.  Tammy is sending a note to [place], Dr Gwynn Burly  assistant to see if pt can be assist and will contact CM or home health agency Dr Jenny Reichmann is out of office until Next week, Tuesday or Wednesday 1248 CM called [phone] Dr Garret Reddish office CM spoke with Malachy Mood who confirms Dr Yong Channel would not be able to see pt as a new pt until 2018 and recommends pt attempt to be seen by Dr Jenny Reichmann  CM provided pt and husband with information for Back to basics Home med visits [place] [phone]  Accepts medicare, Medicaid, [place] and [place], united health care   [place] calls [place] [address] [place] [address] [phone]   plus updated them on response from Dr Jenny Reichmann and Dr Yong Channel offices

## 2016-05-21 NOTE — ED Notes (Signed)
[name], Case manager, talking with patient's husband

## 2016-05-21 NOTE — [facility] Suicide Risk Assessment (Signed)
Suicide Risk Assessment  Discharge Assessment   [facility]   Principal Problem: Adjustment disorder with depressed mood Discharge Diagnoses:  Patient Active Problem List   Diagnosis Date Noted  . Adjustment disorder with depressed mood [F43.21] 10/07/2014    Priority: High  . HYPERLIPIDEMIA [E78.5] 09/09/2007  . OSTEOPOROSIS [M81.0] 09/09/2007  . HEADACHE [R51] 09/09/2007  . SKIN CANCER, HX OF [Z85.828] 09/06/2007    Total Time spent with patient: 45 minutes   Musculoskeletal: Strength & Muscle Tone: decreased Gait & Station: unsteady Patient leans: N/A  Psychiatric Specialty Exam: Physical Exam  Constitutional: She is oriented to person, place, and time.  HENT:  Head: Normocephalic.  Neck: Normal range of motion.  Respiratory: Effort normal.  GI: Soft.  Musculoskeletal: Normal range of motion.  Neurological: She is alert and oriented to person, place, and time.  Skin: Skin is warm and dry.  Psychiatric: Her speech is normal and behavior is normal. Judgment and thought content normal. Cognition and memory are normal. She exhibits a depressed mood.    Review of Systems  Constitutional: Negative.   HENT: Negative.   Eyes: Negative.   Respiratory: Negative.   Cardiovascular: Negative.   Gastrointestinal: Negative.   Genitourinary: Negative.   Musculoskeletal: Negative.   Skin: Negative.   Neurological: Negative.   Endo/Heme/Allergies: Negative.   Psychiatric/Behavioral: Positive for depression.    Blood pressure 91/54, pulse 95, temperature 97.6 F (36.4 C), temperature source Oral, resp. rate 23, SpO2 97 %.There is no weight on file to calculate BMI.  General Appearance: Casual  Eye Contact:  Good  Speech:  Normal Rate  Volume:  Normal  Mood:  Depressed, mild  Affect:  Congruent  Thought Process:  Coherent  Orientation:  Full (Time, Place, and Person)  Thought Content:  Rumination  Suicidal Thoughts:  No  Homicidal Thoughts:  No   Memory:  Immediate;   Good Recent;   Good Remote;   Good  Judgement:  Fair  Insight:  Fair  Psychomotor Activity:  Decreased  Concentration:  Concentration: Good and Attention Span: Good  Recall:  Good  Fund of Knowledge:  Good  Language:  Good  Akathisia:  No  Handed:  Right  AIMS (if indicated):     Assets:  Housing Intimacy Leisure Time Physical Health Resilience Social Support  ADL's:  Intact  Cognition:  WNL  Sleep:       Mental Status Per Nursing Assessment::   On Admission:   Not eating  Demographic Factors:  Age 80 or older and Caucasian  Loss Factors: NA  Historical Factors: NA  Risk Reduction Factors:   Sense of responsibility to family, Religious beliefs about death, Living with another person, especially a relative and Positive social support  Continued Clinical Symptoms:  Depression, mild  Cognitive Features That Contribute To Risk:  None    Suicide Risk:  Minimal: No identifiable suicidal ideation.  Patients presenting with no risk factors but with morbid ruminations; may be classified as minimal risk based on the severity of the depressive symptoms    Plan Of Care/Follow-up recommendations:  Activity:  as tolerated Diet:  heart healthy diet  LORD, JAMISON, NP 05/21/2016, 10:49 AM

## 2016-05-21 NOTE — ED Provider Notes (Signed)
CSN: [id number]     Arrival date & time 05/20/16  1602 History   First MD Initiated Contact with Patient 05/20/16 1610     Chief Complaint  Patient presents with  . Altered Mental Status      HPI  Patient presents via EMS from home accompanied by her husband. She will not eat, speak, or open her eyes.  Her husband offers an exceedingly convoluted story. He states that they are doing a "life review". He is a retired Veterinary surgeon. He did some counseling professionally, and apparently through his church. Is able to the room he starts talking to his wife and holding her hand and stating his reasoning to her why she is still important to him. He states that he is speaking to her as he was taught to another counseling sessions in the past.  Interestingly, she presented with a similar episode in September of last year. Sat overnight was reviewed the following morning and began talking and speaking and was diagnosed with an adjustment disorder. Counseling was recommended. They did not go.  Their family in [place] and [place]. No one here locally. They've been in for 16 years. Has been essentially states that he loves her house, but she wants to go to [facility], an assisted living facility and this is been a point of contention between the 2 of them recently.  Past Medical History  Diagnosis Date  . Osteoporosis   . CLOSTRIDIUM DIFFICILE COLITIS 09/09/2007    Qualifier: Diagnosis of  By: Jonny Ruiz MD, Len Blalock   . PERIPHERAL NEUROPATHY 08/08/2008    Qualifier: Diagnosis of  By: Jonny Ruiz MD, Len Blalock   . VERTEBRAL FRACTURE, THORACIC SPINE 12/29/2007    Qualifier: Diagnosis of  By: Jonny Ruiz MD, Len Blalock   . FRACTURE, WRIST 09/06/2007    Qualifier: Diagnosis of  By: Maris Berger   . Adult failure to thrive 08/08/2008    Qualifier: Diagnosis of  By: Jonny Ruiz MD, Len Blalock    History reviewed. No pertinent past surgical history. No family history on file. Social History  Substance Use Topics  . Smoking  status: Never Smoker   . Smokeless tobacco: None  . Alcohol Use: No   OB History    No data available     Review of Systems  Unable to perform ROS: Patient nonverbal      Allergies  Review of patient's allergies indicates no known allergies.  Home Medications   Prior to Admission medications   Medication Sig Start Date End Date Taking? Authorizing Provider  Calcium Carbonate-Vitamin D (CALTRATE 600+D PO) Take 1 tablet by mouth daily.   Yes Historical Provider, MD  Multiple Vitamin (MULTIVITAMIN WITH MINERALS) TABS tablet Take 1 tablet by mouth daily.   Yes Historical Provider, MD  desonide (DESOWEN) 0.05 % ointment Apply 1 application topically daily as needed (for skin on face).     Historical Provider, MD   BP 102/66 mmHg  Pulse 67  Temp(Src)   Resp 14  SpO2 92% Physical Exam  Constitutional: She appears well-developed and well-nourished. No distress.  Thin frail appearing elderly female. Eyes closed. Her facial gestures do seem to respond to some of the motion of the patient's husbands conversation with her.  HENT:  Head: Normocephalic.  Eyes: Conjunctivae are normal. Pupils are equal, round, and reactive to light. No scleral icterus.  Neck: Normal range of motion. Neck supple. No thyromegaly present.  Cardiovascular: Normal rate and regular rhythm.  Exam reveals no gallop  and no friction rub.   No murmur heard. Pulmonary/Chest: Effort normal and breath sounds normal. No respiratory distress. She has no wheezes. She has no rales.  Abdominal: Soft. Bowel sounds are normal. She exhibits no distension. There is no tenderness. There is no rebound.  Musculoskeletal: Normal range of motion.  Neurological: She is alert.  Skin: Skin is warm and dry. No rash noted.  Psychiatric: She has a normal mood and affect. Her behavior is normal.    ED Course  Procedures (including critical care time) Labs Review Labs Reviewed  CBC - Abnormal; Notable for the following:     Platelets 128 (*)    All other components within normal limits  URINALYSIS, ROUTINE W REFLEX MICROSCOPIC (NOT AT [facility]) - Abnormal; Notable for the following:    APPearance TURBID (*)    Ketones, ur 15 (*)    Leukocytes, UA SMALL (*)    All other components within normal limits  URINE MICROSCOPIC-ADD ON - Abnormal; Notable for the following:    Squamous Epithelial / LPF 0-5 (*)    Bacteria, UA FEW (*)    All other components within normal limits  URINE CULTURE  COMPREHENSIVE METABOLIC PANEL  CBG MONITORING, ED    Imaging Review No results found. I have personally reviewed and evaluated these images and lab results as part of my medical decision-making.   EKG Interpretation None      MDM   Final diagnoses:  Catatonia (HCC)    Very likely emotional crisis, adjustment disorder, conversion reaction. No signs that this is a medical illness. She will move all 4 extremities and is neurologically nonfocal other than being nonverbal and catatonic. Agree with overnight all doesn't formal psychiatric evaluation in the morning.    Rolland PorterMark Luellen Howson, MD 05/21/16 [phone]

## 2016-05-21 NOTE — ED Notes (Signed)
Dr. Patria Maneampos notified that pts husband concerned about taking pt home and would like resources. [place]ing for [place] to speak pt and husband.

## 2016-05-21 NOTE — Progress Notes (Signed)
Husband very pleased with service Cm provided CM business card  Cm also provided recommend Home visit MD recommended by Sheral [facility]wina [facility] Dr Annita BrodPhilip Asenso [address] [place], [place] [id number] [phone] (does not request you to change coverage) Husband voiced interest in contacting Dr Darleene CleaverAsenso  ED RN updated Pt finished eating lunch

## 2016-05-21 NOTE — ED Provider Notes (Signed)
12:21 PM Patient felt to be stable from a mental health standpoint from the psychiatric services.  They've written a prescription for Celexa and given the patient and the patient's spouse [facility].  I was involved to help assist with additional resources and home health for home.  Case management consult did an additional resources will be had at home to assist the husband.  They will work on placement as an outpatient.  Azalia BilisKevin Ashlen Kiger, MD 05/21/16 [phone]

## 2016-05-21 NOTE — Telephone Encounter (Signed)
Allison Callahan case Production designer, theatre/television/filmmanager with [facility] needs to send patient home with home health care .  Patient has not been seen by Dr. Jonny RuizJohn since 2009.  Patient does not have a PCP.  Allison Kaufmannold Allison Callahan we would ask to see if Dr. Jonny RuizJohn would be able to follow up with patient after discharge on home health care.  [name] is also going to contact patients spouses, doctor, DR. Hunter at [facility] to see if he might be able to.  Notified Allison Callahan that Dr. Jonny RuizJohn would be out of the office until Tuesday.  Stated that would be ok for a response then.  Please follow back up with [name] in regard.

## 2016-05-21 NOTE — Consult Note (Signed)
[place] Psychiatry Consult   Reason for Consult:  Not eating Referring Physician:  EDP Patient Identification: Allison Callahan MRN:  [id number] Principal Diagnosis: Adjustment disorder with depressed mood Diagnosis:   Patient Active Problem List   Diagnosis Date Noted  . Adjustment disorder with depressed mood [F43.21] 10/07/2014    Priority: High  . HYPERLIPIDEMIA [E78.5] 09/09/2007  . OSTEOPOROSIS [M81.0] 09/09/2007  . HEADACHE [R51] 09/09/2007  . SKIN CANCER, HX OF [Z85.828] 09/06/2007    Total Time spent with patient: 45 minutes  Subjective:   Allison Callahan is a 81 y.o. female patient does not warrant admission.  HPI:  82 yo female who came to the ED per her husband because she did not eat or drink yesterday and stopped talking.  Today, she "feels better" and has eaten and drank a cup of juice.  She and her husband are making major life style changes, moving to a continuing care facility.  He feels she became overwhelmed and "shut down" but improved today. They have lived in [place] for 16 years and like where they are living but her husband is tiring of caring for her.  He has called their home health agency to increase her time from once a week to more frequent.  Kianni denies suicidal/homicidal ideations, hallucinations, and alcohol/drug abuse.  Agreeable to start Celexa 10 mg for depression and anxiety, outpatient provider recommended.  Past Psychiatric History: adjustment disorder  Risk to Self: Suicidal Ideation: No Suicidal Intent: No Is patient at risk for suicide?: No Suicidal Plan?: No Access to Means: No What has been your use of drugs/alcohol within the last 12 months?: Denies How many times?: 0 Other Self Harm Risks: None Triggers for Past Attempts:  (NA) Intentional Self Injurious Behavior: None Risk to Others: Homicidal Ideation: No Thoughts of Harm to Others: No Current Homicidal Intent: No Current Homicidal Plan: No Access to Homicidal  Means: No Identified Victim: na History of harm to others?: No Assessment of Violence: None Noted Violent Behavior Description: na Does patient have access to weapons?: No Criminal Charges Pending?: No Does patient have a court date: No Prior Inpatient Therapy: Prior Inpatient Therapy: No Prior Therapy Dates: na Prior Therapy Facilty/Provider(s): [name] Reason for Treatment: Adjustment D/O Prior Outpatient Therapy: Prior Outpatient Therapy: No Prior Therapy Dates: 2017 Prior Therapy Facilty/Provider(s): none noted Reason for Treatment: Depression Does patient have an ACCT team?: No Does patient have Intensive In-House Services?  : No Does patient have [facility] services? : No Does patient have P4CC services?: No  Past Medical History:  Past Medical History  Diagnosis Date  . Osteoporosis   . CLOSTRIDIUM DIFFICILE COLITIS 09/09/2007    Qualifier: Diagnosis of  By: Jenny Reichmann MD, Hunt Oris   . PERIPHERAL NEUROPATHY 08/08/2008    Qualifier: Diagnosis of  By: Jenny Reichmann MD, [place], THORACIC SPINE 12/29/2007    Qualifier: Diagnosis of  By: Jenny Reichmann MD, Hunt Oris   . FRACTURE, WRIST 09/06/2007    Qualifier: Diagnosis of  By: Elveria Royals   . Adult failure to thrive 08/08/2008    Qualifier: Diagnosis of  By: Jenny Reichmann MD, Hunt Oris    History reviewed. No pertinent past surgical history. Family History: No family history on file. Family Psychiatric  History: none Social History:  History  Alcohol Use No     History  Drug Use Not on file    Social History   Social History  . Marital Status: Married  Spouse Name: N/A  . Number of Children: N/A  . Years of Education: N/A   Social History Main Topics  . Smoking status: Never Smoker   . Smokeless tobacco: None  . Alcohol Use: No  . Drug Use: None  . Sexual Activity: Not Asked   Other Topics Concern  . None   Social History Narrative   Additional Social History:    Allergies:  No Known Allergies  Labs:   Results for orders placed or performed during the hospital encounter of 05/20/16 (from the past 48 hour(s))  Comprehensive metabolic panel     Status: None   Collection Time: 05/20/16  4:37 PM  Result Value Ref Range   Sodium 139 135 - 145 mmol/L   Potassium 3.8 3.5 - 5.1 mmol/L   Chloride 102 101 - 111 mmol/L   CO2 31 22 - 32 mmol/L   Glucose, Bld 82 65 - 99 mg/dL   BUN 17 6 - 20 mg/dL   Creatinine, Ser 0.46 0.44 - 1.00 mg/dL   Calcium 8.9 8.9 - 10.3 mg/dL   Total Protein 6.8 6.5 - 8.1 g/dL   Albumin 4.0 3.5 - 5.0 g/dL   AST 28 15 - 41 U/L   ALT 17 14 - 54 U/L   Alkaline Phosphatase 107 38 - 126 U/L   Total Bilirubin 0.9 0.3 - 1.2 mg/dL   GFR calc non Af Amer >60 >60 mL/min   GFR calc Af Amer >60 >60 mL/min    Comment: (NOTE) The eGFR has been calculated using the CKD EPI equation. This calculation has not been validated in all clinical situations. eGFR's persistently <60 mL/min signify possible Chronic Kidney Disease.    Anion gap 6 5 - 15  CBC     Status: Abnormal   Collection Time: 05/20/16  4:37 PM  Result Value Ref Range   WBC 4.5 4.0 - 10.5 K/uL   RBC 4.69 3.87 - 5.11 MIL/uL   Hemoglobin 13.9 12.0 - 15.0 g/dL   HCT 42.0 36.0 - 46.0 %   MCV 89.6 78.0 - 100.0 fL   MCH 29.6 26.0 - 34.0 pg   MCHC 33.1 30.0 - 36.0 g/dL   RDW 12.3 11.5 - 15.5 %   Platelets 128 (L) 150 - 400 K/uL  CBG monitoring, ED     Status: None   Collection Time: 05/20/16  4:50 PM  Result Value Ref Range   Glucose-Capillary 73 65 - 99 mg/dL  Urinalysis, Routine w reflex microscopic (not at [facility])     Status: Abnormal   Collection Time: 05/20/16  6:44 PM  Result Value Ref Range   Color, Urine YELLOW YELLOW   APPearance TURBID (A) CLEAR   Specific Gravity, Urine 1.021 1.005 - 1.030   pH 8.0 5.0 - 8.0   Glucose, UA NEGATIVE NEGATIVE mg/dL   Hgb urine dipstick NEGATIVE NEGATIVE   Bilirubin Urine NEGATIVE NEGATIVE   Ketones, ur 15 (A) NEGATIVE mg/dL   Protein, ur NEGATIVE NEGATIVE mg/dL    Nitrite NEGATIVE NEGATIVE   Leukocytes, UA SMALL (A) NEGATIVE  Urine microscopic-add on     Status: Abnormal   Collection Time: 05/20/16  6:44 PM  Result Value Ref Range   Squamous Epithelial / LPF 0-5 (A) NONE SEEN   WBC, UA 6-30 0 - 5 WBC/hpf   RBC / HPF 0-5 0 - 5 RBC/hpf   Bacteria, UA FEW (A) NONE SEEN    No current facility-administered medications for this encounter.   Current  Outpatient Prescriptions  Medication Sig Dispense Refill  . Calcium Carbonate-Vitamin D (CALTRATE 600+D PO) Take 1 tablet by mouth daily.    . Multiple Vitamin (MULTIVITAMIN WITH MINERALS) TABS tablet Take 1 tablet by mouth daily.    Marland Kitchen desonide (DESOWEN) 0.05 % ointment Apply 1 application topically daily as needed (for skin on face).       Musculoskeletal: Strength & Muscle Tone: decreased Gait & Station: unsteady Patient leans: N/A  Psychiatric Specialty Exam: Physical Exam  Constitutional: She is oriented to person, place, and time.  HENT:  Head: Normocephalic.  Neck: Normal range of motion.  Respiratory: Effort normal.  GI: Soft.  Musculoskeletal: Normal range of motion.  Neurological: She is alert and oriented to person, place, and time.  Skin: Skin is warm and dry.  Psychiatric: Her speech is normal and behavior is normal. Judgment and thought content normal. Cognition and memory are normal. She exhibits a depressed mood.    Review of Systems  Constitutional: Negative.   HENT: Negative.   Eyes: Negative.   Respiratory: Negative.   Cardiovascular: Negative.   Gastrointestinal: Negative.   Genitourinary: Negative.   Musculoskeletal: Negative.   Skin: Negative.   Neurological: Negative.   Endo/Heme/Allergies: Negative.   Psychiatric/Behavioral: Positive for depression.    Blood pressure 91/54, pulse 95, temperature 97.6 F (36.4 C), temperature source Oral, resp. rate 23, SpO2 97 %.There is no weight on file to calculate BMI.  General Appearance: Casual  Eye Contact:  Good   Speech:  Normal Rate  Volume:  Normal  Mood:  Depressed, mild  Affect:  Congruent  Thought Process:  Coherent  Orientation:  Full (Time, Place, and Person)  Thought Content:  Rumination  Suicidal Thoughts:  No  Homicidal Thoughts:  No  Memory:  Immediate;   Good Recent;   Good Remote;   Good  Judgement:  Fair  Insight:  Fair  Psychomotor Activity:  Decreased  Concentration:  Concentration: Good and Attention Span: Good  Recall:  Good  Fund of Knowledge:  Good  Language:  Good  Akathisia:  No  Handed:  Right  AIMS (if indicated):     Assets:  Housing Intimacy Leisure Time Physical Health Resilience Social Support  ADL's:  Intact  Cognition:  WNL  Sleep:        Treatment Plan Summary: Daily contact with patient to assess and evaluate symptoms and progress in treatment, Medication management and Plan adjustment disorder with depressed mood:  -Crisis stabilization -Medication management:  Start Celexa 10 mg daily for depression.  Continue her Calcium and vitamins. -Individual counseling -Rx for Celexa, outpatient resources  Disposition: No evidence of imminent risk to self or others at present.    Waylan Boga, NP 05/21/2016 10:21 AM Patient seen face-to-face for psychiatric evaluation, chart reviewed and case discussed with the physician extender and developed treatment plan. Reviewed the information documented and agree with the treatment plan. Corena Pilgrim, MD

## 2016-05-22 LAB — URINE CULTURE: SPECIAL REQUESTS: NORMAL

## 2016-05-25 ENCOUNTER — Emergency Department (HOSPITAL_COMMUNITY)
Admission: EM | Admit: 2016-05-25 | Discharge: 2016-05-26 | Disposition: A | Discharge status: Other Acute Inpatient Hospital | Payer: Medicare Other | Attending: Emergency Medicine | Admitting: Emergency Medicine

## 2016-05-25 ENCOUNTER — Encounter (HOSPITAL_COMMUNITY): Payer: Self-pay | Admitting: Nurse Practitioner

## 2016-05-25 DIAGNOSIS — M810 Age-related osteoporosis without current pathological fracture: Secondary | ICD-10-CM | POA: Diagnosis not present

## 2016-05-25 DIAGNOSIS — F329 Major depressive disorder, single episode, unspecified: Secondary | ICD-10-CM | POA: Diagnosis present

## 2016-05-25 DIAGNOSIS — F4321 Adjustment disorder with depressed mood: Secondary | ICD-10-CM | POA: Diagnosis not present

## 2016-05-25 DIAGNOSIS — R45851 Suicidal ideations: Secondary | ICD-10-CM | POA: Diagnosis not present

## 2016-05-25 DIAGNOSIS — Z049 Encounter for examination and observation for unspecified reason: Secondary | ICD-10-CM

## 2016-05-25 LAB — RAPID URINE DRUG SCREEN, HOSP PERFORMED
AMPHETAMINES: NOT DETECTED
BENZODIAZEPINES: NOT DETECTED
Barbiturates: NOT DETECTED
Cocaine: NOT DETECTED
Opiates: NOT DETECTED
TETRAHYDROCANNABINOL: NOT DETECTED

## 2016-05-25 LAB — COMPREHENSIVE METABOLIC PANEL
ALK PHOS: 91 U/L (ref 38–126)
ALT: 19 U/L (ref 14–54)
AST: 30 U/L (ref 15–41)
Albumin: 3.7 g/dL (ref 3.5–5.0)
Anion gap: 6 (ref 5–15)
BILIRUBIN TOTAL: 0.6 mg/dL (ref 0.3–1.2)
BUN: 19 mg/dL (ref 6–20)
CO2: 33 mmol/L — ABNORMAL HIGH (ref 22–32)
CREATININE: 0.49 mg/dL (ref 0.44–1.00)
Calcium: 8.7 mg/dL — ABNORMAL LOW (ref 8.9–10.3)
Chloride: 101 mmol/L (ref 101–111)
GFR calc Af Amer: >60 mL/min (ref >60)
GLUCOSE: 88 mg/dL (ref 65–99)
Potassium: 3.9 mmol/L (ref 3.5–5.1)
Sodium: 140 mmol/L (ref 135–145)
TOTAL PROTEIN: 6.2 g/dL — AB (ref 6.5–8.1)

## 2016-05-25 LAB — CBC WITH DIFFERENTIAL/PLATELET
BASOS ABS: 0 10*3/uL (ref 0.0–0.1)
BASOS PCT: 0 %
EOS ABS: 0 10*3/uL (ref 0.0–0.7)
Eosinophils Relative: 1 %
HEMATOCRIT: 40.1 % (ref 36.0–46.0)
Hemoglobin: 13.4 g/dL (ref 12.0–15.0)
Lymphocytes Relative: 41 %
Lymphs Abs: 1.7 10*3/uL (ref 0.7–4.0)
MCH: 30 pg (ref 26.0–34.0)
MCHC: 33.4 g/dL (ref 30.0–36.0)
MCV: 89.9 fL (ref 78.0–100.0)
MONO ABS: 1.4 10*3/uL — AB (ref 0.1–1.0)
Monocytes Relative: 33 %
NEUTROS ABS: 1 10*3/uL — AB (ref 1.7–7.7)
NEUTROS PCT: 25 %
Platelets: 127 10*3/uL — ABNORMAL LOW (ref 150–400)
RBC: 4.46 MIL/uL (ref 3.87–5.11)
RDW: 12.4 % (ref 11.5–15.5)
WBC: 4.2 10*3/uL (ref 4.0–10.5)

## 2016-05-25 LAB — URINALYSIS, ROUTINE W REFLEX MICROSCOPIC
BILIRUBIN URINE: NEGATIVE
GLUCOSE, UA: NEGATIVE mg/dL
HGB URINE DIPSTICK: NEGATIVE
Ketones, ur: NEGATIVE mg/dL
Leukocytes, UA: NEGATIVE
Nitrite: NEGATIVE
Protein, ur: NEGATIVE mg/dL
Specific Gravity, Urine: 1.02 (ref 1.005–1.030)
pH: 8 (ref 5.0–8.0)

## 2016-05-25 LAB — ETHANOL

## 2016-05-25 NOTE — ED Provider Notes (Signed)
CSN: [id number]     Arrival date & time 05/25/16  1657 History   First MD Initiated Contact with Patient 05/25/16 1703     Chief Complaint  Patient presents with  . Depressed   . Won't Talk to Spouse      (Consider location/radiation/quality/duration/timing/severity/associated sxs/prior Treatment) HPI Patient seen last week for similar presentation. He comes in by EMS. Called by her husband after patient refused to talk or open her eyes. This happened after doing a "life review" with a discussed moving to a living facility. This appears to be the trigger for the similar episodes in the past. Patient was prescribed Celexa but has not yet started taking the medication. Vital signs were stable in route per EMS. The patient states her eyes closed and is not speaking she is making purposeful movements. Occasionally will peek when she believes no one is looking per EMS. Level V caveat. Past Medical History  Diagnosis Date  . Osteoporosis   . CLOSTRIDIUM DIFFICILE COLITIS 09/09/2007    Qualifier: Diagnosis of  By: [name], [name]   . PERIPHERAL NEUROPATHY 08/08/2008    Qualifier: Diagnosis of  By: [name], [name]   . VERTEBRAL FRACTURE, THORACIC SPINE 12/29/2007    Qualifier: Diagnosis of  By: [name], [name]   . FRACTURE, WRIST 09/06/2007    Qualifier: Diagnosis of  By: [name], [name]   . Adult failure to thrive 08/08/2008    Qualifier: Diagnosis of  By: [name], [name]    History reviewed. No pertinent past surgical history. History reviewed. No pertinent family history. Social History  Substance Use Topics  . Smoking status: Never Smoker   . Smokeless tobacco: None  . Alcohol Use: No   OB History    No data available     Review of Systems  Unable to perform ROS: Patient nonverbal      Allergies  Review of patient's allergies indicates no known allergies.  Home Medications   Prior to Admission medications   Medication Sig Start Date End Date Taking? Authorizing  Provider  Calcium Carbonate-Vitamin D (CALTRATE 600+D PO) Take 1 tablet by mouth daily.   Yes Historical Provider, MD  desonide (DESOWEN) 0.05 % ointment Apply 1 application topically daily as needed (for skin on face).    Yes Historical Provider, MD  Multiple Vitamin (MULTIVITAMIN WITH MINERALS) TABS tablet Take 1 tablet by mouth daily.   Yes Historical Provider, MD  citalopram (CELEXA) 10 MG tablet Take 1 tablet (10 mg total) by mouth daily. Patient not taking: Reported on 05/25/2016 05/21/16   Charm RingsJamison Y Lord, NP   BP 126/68 mmHg  Pulse 65  Resp 16  SpO2 96% Physical Exam  Constitutional: She appears well-developed and well-nourished. No distress.  HENT:  Head: Normocephalic and atraumatic.  Mouth/Throat: Oropharynx is clear and moist.  Patient voluntarily opens mouth when asked.  Eyes: EOM are normal. Pupils are equal, round, and reactive to light.  Patient averts eyes when lids are opened.  Neck: Normal range of motion. Neck supple.  No meningismus.  Cardiovascular: Normal rate and regular rhythm.  Exam reveals no gallop and no friction rub.   No murmur heard. Pulmonary/Chest: Effort normal and breath sounds normal. No respiratory distress. She has no wheezes. She has no rales.  Abdominal: Soft. Bowel sounds are normal. She exhibits no distension and no mass. There is no tenderness. There is no rebound and no guarding.  Musculoskeletal: Normal range of motion. She exhibits  no edema or tenderness.  Neurological:  Appears to be purposely closing eyes. She does have spontaneous movement of all extremities. Sensation appears to be intact.  Skin: Skin is warm and dry. No rash noted. No erythema.  Nursing note and vitals reviewed.   ED Course  Procedures (including critical care time) Labs Review Labs Reviewed  CBC WITH DIFFERENTIAL/PLATELET - Abnormal; Notable for the following:    Platelets 127 (*)    Neutro Abs 1.0 (*)    Monocytes Absolute 1.4 (*)    All other components within  normal limits  COMPREHENSIVE METABOLIC PANEL - Abnormal; Notable for the following:    CO2 33 (*)    Calcium 8.7 (*)    Total Protein 6.2 (*)    All other components within normal limits  URINALYSIS, ROUTINE W REFLEX MICROSCOPIC (NOT AT [facility]) - Abnormal; Notable for the following:    APPearance HAZY (*)    All other components within normal limits  ETHANOL  URINE RAPID DRUG SCREEN, HOSP PERFORMED    Imaging Review No results found. I have personally reviewed and evaluated these images and lab results as part of my medical decision-making.   EKG Interpretation None      MDM   Final diagnoses:  Adjustment disorder with depressed mood    Likely adjustment disorder/malingering. We'll screen medically and have psychiatric team evaluate.  Patient is medically cleared for psychiatric evaluation. Patient refusing to speak with Ala Dach from behavioral health. We'll keep overnight for psychiatric consult in the morning.  Loren Racer, MD 05/25/16 [phone]

## 2016-05-25 NOTE — ED Notes (Signed)
Bed: WA21 Expected date:  Expected time:  Means of arrival:  Comments: Ems ams

## 2016-05-25 NOTE — ED Notes (Signed)
[phone] Richard Husband

## 2016-05-25 NOTE — ED Notes (Signed)
Pt is presented from by spouse who reports that pt has suddenly refused to talk to him. Of note, pt was seen 05/20/2013 for a similar incidence and spouse endorses that pt has been depressed.

## 2016-05-25 NOTE — BH Assessment (Addendum)
Tele Assessment Note   Allison Callahan is an 82 y.o. female who presents to [name] ED via EMS and accompanied by her husband, simone rodenbeck [phone], who participated in assessment. Pt refuses to speak, look at staff or husband, or participate in assessment. Pt's husband reports he called EMS because today Pt would not open her eyes, eat, drink or respond to him. Husband states this behavior was following a discussion about moving to an assisted living facility. Pt was at [facility] with similar presentation. She was diagnosed with adjustment disorder with depressed mood and prescribed Celexa but Pt has refused to take any medications. Husband is concerned that Pt is giving up on life and says she recently made a reference to the dress she wants to be buried in. Husband is also concerned that Pt is constipated.   Pt is dressed in hospital gown and curled up under a blanket. When this TTS counselor enter the room Pt said "turn off the light" but then would not say anything else. Pt also appeared to briefly look to see if counselor had left the room. Pt's medical record indicates she was psychiatrically hospitalized in 2001 at [facility] and diagnosed with major depressive disorder.   Diagnosis: Adjustment Disorder with Depressed Mood  Past Medical History:  Past Medical History  Diagnosis Date  . Osteoporosis   . CLOSTRIDIUM DIFFICILE COLITIS 09/09/2007    Qualifier: Diagnosis of  By: Jonny Ruiz MD, Len Blalock   . PERIPHERAL NEUROPATHY 08/08/2008    Qualifier: Diagnosis of  By: Jonny Ruiz MD, Len Blalock   . VERTEBRAL FRACTURE, THORACIC SPINE 12/29/2007    Qualifier: Diagnosis of  By: Jonny Ruiz MD, Len Blalock   . FRACTURE, WRIST 09/06/2007    Qualifier: Diagnosis of  By: Maris Berger   . Adult failure to thrive 08/08/2008    Qualifier: Diagnosis of  By: Jonny Ruiz MD, Len Blalock     History reviewed. No pertinent past surgical history.  Family History: History reviewed. No pertinent family  history.  Social History:  reports that she has never smoked. She does not have any smokeless tobacco history on file. She reports that she does not drink alcohol. Her drug history is not on file.  Additional Social History:  Alcohol / Drug Use Pain Medications: See MAR Prescriptions: See MAR Over the Counter: See MAR History of alcohol / drug use?: No history of alcohol / drug abuse Longest period of sobriety (when/how long): NA  CIWA: CIWA-Ar BP: 126/68 mmHg Pulse Rate: 65 COWS:    PATIENT STRENGTHS: (choose at least two) Average or above average intelligence Financial means Supportive family/friends  Allergies: No Known Allergies  Home Medications:  (Not in a hospital admission)  OB/GYN Status:  No LMP recorded. Patient is postmenopausal.  General Assessment Data Location of Assessment: [facility] ED TTS Assessment: In system Is this a Tele or Face-to-Face Assessment?: Face-to-Face Is this an Initial Assessment or a Re-assessment for this encounter?: Initial Assessment Marital status: Married [place] name: NA Is patient pregnant?: No Pregnancy Status: No Living Arrangements: Spouse/significant other Can pt return to current living arrangement?: Yes Admission Status: Voluntary Is patient capable of signing voluntary admission?: Yes Referral Source: Self/Family/Friend Insurance type: Medicare     Crisis Care Plan Living Arrangements: Spouse/significant other Legal Guardian: Other: (Self) Name of Psychiatrist: none Name of Therapist: none  Education Status Is patient currently in school?: No Current Grade: NA Highest grade of school patient has completed: 12 Name of school: na  Contact person: na  Risk to self with the past 6 months Suicidal Ideation: No Suicidal Intent: No Is patient at risk for suicide?: No Suicidal Plan?: No Access to Means: No What has been your use of drugs/alcohol within the last 12 months?: None Previous Attempts/Gestures: No How many  times?: 0 Other Self Harm Risks: None Triggers for Past Attempts: None known (NA) Intentional Self Injurious Behavior: None Family Suicide History: No Recent stressful life event(s): Other (Comment) (Considering moving to assisted living) Persecutory voices/beliefs?: No Depression: Yes Depression Symptoms: Despondent, Isolating Substance abuse history and/or treatment for substance abuse?: No Suicide prevention information given to non-admitted patients: Not applicable  Risk to Others within the past 6 months Homicidal Ideation: No Does patient have any lifetime risk of violence toward others beyond the six months prior to admission? : No Thoughts of Harm to Others: No Current Homicidal Intent: No Current Homicidal Plan: No Access to Homicidal Means: No Identified Victim: None History of harm to others?: No Assessment of Violence: None Noted Violent Behavior Description: None Does patient have access to weapons?: No Criminal Charges Pending?: No Does patient have a court date: No Is patient on probation?: No  Psychosis Hallucinations: None noted Delusions: None noted  Mental Status Report Appearance/Hygiene: In scrubs, In hospital gown Eye Contact: Poor Motor Activity: Unable to assess Speech: Unable to assess Level of Consciousness: Unable to assess Mood: Other (Comment) (Unable to assess, Pt not speaking.) Affect: Unable to Assess Anxiety Level:  (Unable to assess, Pt not speaking.) Thought Processes: Unable to Assess Judgement: Unable to Assess Orientation: Unable to assess Obsessive Compulsive Thoughts/Behaviors: Unable to Assess  Cognitive Functioning Concentration: Unable to Assess Memory: Unable to Assess IQ: Average Insight: Unable to Assess Impulse Control: Unable to Assess Appetite: Poor Weight Loss:  (Unknown) Weight Gain: 0 Sleep: Increased Total Hours of Sleep: 10 Vegetative Symptoms: Staying in bed  ADLScreening [facility] Assessment  Services) Patient's cognitive ability adequate to safely complete daily activities?:  (UTA) Patient able to express need for assistance with ADLs?: Yes (per husband) Independently performs ADLs?: No  Prior Inpatient Therapy Prior Inpatient Therapy: Yes Prior Therapy Dates: 2000 Prior Therapy Facilty/Provider(s): [facility] Reason for Treatment: DEpression  Prior Outpatient Therapy Prior Outpatient Therapy: No Prior Therapy Dates: 2017 Prior Therapy Facilty/Provider(s): none noted Reason for Treatment: Depression Does patient have an ACCT team?: No Does patient have Intensive In-House Services?  : No Does patient have [facility] services? : No Does patient have P4CC services?: No  ADL Screening (condition at time of admission) Patient's cognitive ability adequate to safely complete daily activities?:  (UTA) Patient able to express need for assistance with ADLs?: Yes (per husband) Independently performs ADLs?: No       Abuse/Neglect Assessment (Assessment to be complete while patient is alone) Physical Abuse: Denies Verbal Abuse: Denies Sexual Abuse: Denies Exploitation of patient/patient's resources: Denies Self-Neglect: Denies     Merchant navy officer (For Healthcare) Does patient have an advance directive?: No Would patient like information on creating an advanced directive?: No - patient declined information    Additional Information 1:1 In Past 12 Months?: No CIRT Risk: No Elopement Risk: No Does patient have medical clearance?: Yes     Disposition: Gave clinical report to Alberteen Sam, NP who recommends Pt be observed overnight and evaluated by psychiatry in the morning. Notified Dr. Loren Racer of recommendation.  Disposition Initial Assessment Completed for this Encounter: Yes Disposition of Patient: Other dispositions Other disposition(s): Other (Comment) (patient will be re-evaluated in  the a.m.)   Harlin RainFord Ellis Patsy BaltimoreWarrick Jr, [facility], [facility], [facility] Triage  Specialist [phone]   Pamalee LeydenWarrick Jr, Christyl Osentoski Ellis 05/25/2016 11:20 PM

## 2016-05-26 ENCOUNTER — Emergency Department (HOSPITAL_COMMUNITY): Payer: Medicare Other

## 2016-05-26 DIAGNOSIS — F4321 Adjustment disorder with depressed mood: Secondary | ICD-10-CM | POA: Diagnosis not present

## 2016-05-26 DIAGNOSIS — R45851 Suicidal ideations: Secondary | ICD-10-CM

## 2016-05-26 MED ORDER — LIDOCAINE 5 % EX PTCH
1.0000 | MEDICATED_PATCH | CUTANEOUS | Status: DC
  Administered 2016-05-26: 1 via TRANSDERMAL
  Filled 2016-05-26: qty 1

## 2016-05-26 MED ORDER — TRAZODONE HCL 50 MG PO TABS: 50.0000 mg | ORAL_TABLET | Freq: Every evening | ORAL | Status: DC | PRN

## 2016-05-26 MED ORDER — TRAMADOL HCL 50 MG PO TABS
25.0000 mg | ORAL_TABLET | Freq: Four times a day (QID) | ORAL | Status: DC | PRN
  Administered 2016-05-26: 25 mg via ORAL
  Filled 2016-05-26: qty 1

## 2016-05-26 MED ORDER — CITALOPRAM HYDROBROMIDE 10 MG PO TABS
10.0000 mg | ORAL_TABLET | Freq: Every day | ORAL | Status: DC
  Filled 2016-05-26: qty 1

## 2016-05-26 NOTE — Progress Notes (Signed)
Pt has been accepted to [facility] by Dr. Lowanda FosterBeverly Jones.  Per Delorise ShinerGrace in admissions, they can accept pt this pm.  Marylu LundJanet, RN has been informed and will call report to [phone].  VM left for Sheriff re: need for pt transport to the facility.  Pollyann SavoyJody Ridhi Hoffert, KentuckyLCSW ED/Evening Coverage [facility] [phone]

## 2016-05-26 NOTE — Progress Notes (Signed)
CSW faxed patient's referrals:  [facility]  Posey ReaLaVonia Adar Rase, ConnecticutLCSWA [phone] ED CSW 05/26/2016 2:17 PM

## 2016-05-26 NOTE — Progress Notes (Addendum)
Pts husband and friend are at the bedside. Pt did open her mouth an take a small ice chip. Pt promptly closed her mouth and would take anymore. Pt was ordered applesauce, fruit cup and a bagel per husbands request. Lunch was ordered for the pt however the pt would not eat or drink. Per husband her last intake was a small portion of a bagel this am. EDP made aware. Will continue to monitor per EDP. (1:45pm)- EKG done at the bedside. Pt taken via stretcher for a CXR in dept. (1:50pm )Pt was moved to room 33. Husband stated he was told last pm by a counselor [facility]. Informed husband that geri pscyh patients are not admitted across the street and this was confirmed by Elijah Birkom. Husband continued to remain at the nurses station showing the writer the pts Er discharge instructions and trying to convince the writer that the discharge instructions say something  Different. Social work was phoned to explain the admission process to the husband. 3pm Theodoro GristDave came to speak with the husband also.pt does grimace when moved and per husband has had some fractures in her back. Pt has positive BS throughout. abd is flat and nontender. Phoned EDP as pt does appear in discomfort with movement evident by her moaning and grimacing. 4:15pm Husband appears reluctant for his wife to take any pain medication even though she appears uncomfortable,.Phoned EDP to obtain a medication order./Pt was given tramadol with apple sauce. Tolerated well and did tell the writer she was hot. Per Lennox LaityJodi  Social work , the [place] [facility] [place] [facility]. Phoned to give report: [phone]-Husband appears very intrusive and insisted he will [facility] the [facility]. Informed the husband he will not be allowed to ride with the pt in the sheriff's vehicle. Pt was incontinent of urine and was placed in a diaper and bed linen was changed. (6:45pm)Pt refused to eat supper. She did state her pain felt better. 6:55pm Officer Marina Goodellerry phoned that he is in  route. 7pm Report to oncoming shift. 7:20pk Officer Marina Goodellerry arrived and felt the pt needed to be transported via [facility] [employer] and pt has been placed on the list,. Officer Marina GoodellPerry requested we phone himm back once Daylene Poseyetar gives us an arrival time. Charge nurse Aurther Lofterry made aware of the situation . Informed officer Marina Goodellerry that the Er is full and if possible the pt needs to be transported tonight to [facility].

## 2016-05-26 NOTE — Progress Notes (Signed)
   05/26/16 1200  Clinical Encounter Type  Visited With Patient and family together  Visit Type Initial;Psychological support;Spiritual support;ED  Referral From Nurse  Consult/Referral To Chaplain  Spiritual Encounters  Spiritual Needs Prayer;Emotional;Other (Comment) (Pastoral Conversation/Support)  Stress Factors  Patient Stress Factors None identified;Other (Comment) (Was unable to assess)  Family Stress Factors Loss of control;Major life changes;Health changes    I was referred to the patient's room by the nurse who stated that the patient was refusing to speak and would keep her eyes closed. The patient had been at our ED the previous week with similar symptoms.  When I arrived at the room the husband was at the bedside and was visibly anxious. The patient's eyes were closed and she did not answer to my introduction.  The patient remained silent throughout the entire visit and would not answer any questions.  The husband would try to talk to the patient and plead with her to talk.  Patient's husband stated that they are [employer] and that their Zorita Pangriest would be up to visit the patient this afternoon.  He told me a brief history of the patient's life and what they had done together.  They have an adopted son and are big into the Pro Life movement.  The husband stated that he feels that his wife is tired of all the struggles that life brings.  The patient's husband asked that I pray with them and visit again. We prayed with the patient.  I took a prayer shawl and neck pillow to the patient.  I will follow up at a later time.    Chaplain Clint BolderBrittany Orvil Faraone M.Div.

## 2016-05-26 NOTE — ED Notes (Signed)
PT DISCHARGED. PTAR AND [facility] SHERIFF'S OFFICE HER TO TRANSPORT PT TO [facility]. AAOX2. PT IN NO APPARENT DISTRESS OR PAIN. HUSBAND AT THE BEDSIDE. THE OPPORTUNITY TO ASK QUESTIONS WAS PROVIDED.

## 2016-05-26 NOTE — ED Notes (Signed)
[facility]AR HERE TO TRANSPORT PT TO [facility]. UNABLE TO LOCATE OFFICER PERRY FOR THE TRANSPORT. PTAR LEFT, AND WILL RETURN WHEN OFFICER PERRY ARRIVES. HUSBAND MADE AWARE.

## 2016-05-26 NOTE — Progress Notes (Addendum)
ED Cm has received 2 voice messages from spouse left at 0750 and 0755 today [facility] Spouse repeating the same information in both voice messages about his interest in [place] for pt placement for "rehab" States pt is to be seen this am by psychiatry in the ED.  CM reviewed this encounter notes to indicate pt is not speaking esp[ecially when "life review" assessment initiated by ED nursing States he left a message for Dr Jonny RuizJohn 's office and not sure if he will be available- This CM informed spouse on last ED encounter that a voice message was left for Dr Jonny RuizJohn and his assistant at his office and Dr Jonny RuizJohn would be out of the office until Wednesday or Thursday of this week.   CM received 2 voice messages from [place] of [place] at [phone] and [phone] about spouse's frequent calls and voice messages left for her. She has called several times to initiate home health services but has been denied entry to the home. States spouse made her aware pt in ED pending Psych Evaluation  0935 Spoke with Sheral Flowdwina about Cm noted memory issues related to spouse during Cm interaction with him last week Thanked her for attempting to initiate home health services Informed her Cm would updated her on pt status after Psych/[facility] evaluation  ED Cm checked EPIC for any other possible emergency contact, next of kin without success

## 2016-05-26 NOTE — ED Notes (Signed)
ASSUMED CARE OF PT.PT OPENS EYES TO NAME, BUT DOES NOT ANSWER ANY QUESTIONS. PT IN NO APPARENT DISTRESS OR PAIN. HUSBAND AT THE BEDSIDE TRYING TO FEED THE PT. AWAITING FURTHER ORDERS.

## 2016-05-26 NOTE — ED Notes (Signed)
Patient's husband requested I phone the pastor at [facility] [employer] to come visit patient. Father Gwen PoundsKowalski is in meetings until this afternoon but will try to visit then.

## 2016-05-26 NOTE — ED Notes (Signed)
Psych counselor visited patient

## 2016-05-26 NOTE — Consult Note (Signed)
[place] [facility] Consult   Reason for Consult:  Depression  Referring Physician:  EDP Patient Identification: Allison Callahan MRN:  [id number] Principal Diagnosis: Adjustment disorder with depressed mood Diagnosis:   Patient Active Problem List   Diagnosis Date Noted  . Adjustment disorder with depressed mood [F43.21] 10/07/2014    Priority: High  . HYPERLIPIDEMIA [E78.5] 09/09/2007  . OSTEOPOROSIS [M81.0] 09/09/2007  . HEADACHE [R51] 09/09/2007  . SKIN CANCER, HX OF [Z85.828] 09/06/2007    Total Time spent with patient: 45 minutes  Subjective:   Allison Callahan is a 82 y.o. female patient admitted with an increase in depression and refusal of care.  HPI:  On admission:  82 y.o. female who presents to [facility] ED via EMS and accompanied by her husband, Allison Callahan [phone], who participated in assessment. Pt refuses to speak, look at staff or husband, or participate in assessment. Pt's husband reports he called EMS because today Pt would not open her eyes, eat, drink or respond to him. Husband states this behavior was following a discussion about moving to an assisted living facility. Pt was at [facility] with similar presentation. She was diagnosed with adjustment disorder with depressed mood and prescribed Celexa but Pt has refused to take any medications. Husband is concerned that Pt is giving up on life and says she recently made a reference to the dress she wants to be buried in. Husband is also concerned that Pt is constipated.   Pt is dressed in hospital gown and curled up under a blanket. When this TTS counselor enter the room Pt said "turn off the light" but then would not say anything else. Pt also appeared to briefly look to see if counselor had left the room. Pt's medical record indicates she was psychiatrically hospitalized in 2001 at [facility] and diagnosed with major depressive disorder.  Today:  She remains quiet and depressed. Husband is  supportive and at her bedside.  Past Psychiatric History: depression  Risk to Self: Suicidal Ideation: No Suicidal Intent: No Is patient at risk for suicide?: No Suicidal Plan?: No Access to Means: No What has been your use of drugs/alcohol within the last 12 months?: None How many times?: 0 Other Self Harm Risks: None Triggers for Past Attempts: None known (NA) Intentional Self Injurious Behavior: None Risk to Others: Homicidal Ideation: No Thoughts of Harm to Others: No Current Homicidal Intent: No Current Homicidal Plan: No Access to Homicidal Means: No Identified Victim: None History of harm to others?: No Assessment of Violence: None Noted Violent Behavior Description: None Does patient have access to weapons?: No Criminal Charges Pending?: No Does patient have a court date: No Prior Inpatient Therapy: Prior Inpatient Therapy: Yes Prior Therapy Dates: 2000 Prior Therapy Facilty/Provider(s): [facility] Reason for Treatment: DEpression Prior Outpatient Therapy: Prior Outpatient Therapy: No Prior Therapy Dates: 2017 Prior Therapy Facilty/Provider(s): none noted Reason for Treatment: Depression Does patient have an ACCT team?: No Does patient have Intensive In-House Services?  : No Does patient have [facility] services? : No Does patient have P4CC services?: No  Past Medical History:  Past Medical History  Diagnosis Date  . Osteoporosis   . CLOSTRIDIUM DIFFICILE COLITIS 09/09/2007    Qualifier: Diagnosis of  By: Jenny Reichmann MD, Hunt Oris   . PERIPHERAL NEUROPATHY 08/08/2008    Qualifier: Diagnosis of  By: Jenny Reichmann MD, [place], THORACIC SPINE 12/29/2007    Qualifier: Diagnosis of  By: Jenny Reichmann MD,  Hunt Oris   . FRACTURE, WRIST 09/06/2007    Qualifier: Diagnosis of  By: Elveria Royals   . Adult failure to thrive 08/08/2008    Qualifier: Diagnosis of  By: Jenny Reichmann MD, Hunt Oris    History reviewed. No pertinent past surgical history. Family History: History reviewed.  No pertinent family history. Family Psychiatric  History: none Social History:  History  Alcohol Use No     History  Drug Use Not on file    Social History   Social History  . Marital Status: Married    Spouse Name: N/A  . Number of Children: N/A  . Years of Education: N/A   Social History Main Topics  . Smoking status: Never Smoker   . Smokeless tobacco: None  . Alcohol Use: No  . Drug Use: None  . Sexual Activity: Not Asked   Other Topics Concern  . None   Social History Narrative   Additional Social History:    Allergies:  No Known Allergies  Labs:  Results for orders placed or performed during the hospital encounter of 05/25/16 (from the past 48 hour(s))  CBC with Differential/Platelet     Status: Abnormal   Collection Time: 05/25/16  6:00 PM  Result Value Ref Range   WBC 4.2 4.0 - 10.5 K/uL   RBC 4.46 3.87 - 5.11 MIL/uL   Hemoglobin 13.4 12.0 - 15.0 g/dL   HCT 40.1 36.0 - 46.0 %   MCV 89.9 78.0 - 100.0 fL   MCH 30.0 26.0 - 34.0 pg   MCHC 33.4 30.0 - 36.0 g/dL   RDW 12.4 11.5 - 15.5 %   Platelets 127 (L) 150 - 400 K/uL   Neutrophils Relative % 25 %   Neutro Abs 1.0 (L) 1.7 - 7.7 K/uL   Lymphocytes Relative 41 %   Lymphs Abs 1.7 0.7 - 4.0 K/uL   Monocytes Relative 33 %   Monocytes Absolute 1.4 (H) 0.1 - 1.0 K/uL   Eosinophils Relative 1 %   Eosinophils Absolute 0.0 0.0 - 0.7 K/uL   Basophils Relative 0 %   Basophils Absolute 0.0 0.0 - 0.1 K/uL  Comprehensive metabolic panel     Status: Abnormal   Collection Time: 05/25/16  6:00 PM  Result Value Ref Range   Sodium 140 135 - 145 mmol/L   Potassium 3.9 3.5 - 5.1 mmol/L   Chloride 101 101 - 111 mmol/L   CO2 33 (H) 22 - 32 mmol/L   Glucose, Bld 88 65 - 99 mg/dL   BUN 19 6 - 20 mg/dL   Creatinine, Ser 0.49 0.44 - 1.00 mg/dL   Calcium 8.7 (L) 8.9 - 10.3 mg/dL   Total Protein 6.2 (L) 6.5 - 8.1 g/dL   Albumin 3.7 3.5 - 5.0 g/dL   AST 30 15 - 41 U/L   ALT 19 14 - 54 U/L   Alkaline Phosphatase 91 38 -  126 U/L   Total Bilirubin 0.6 0.3 - 1.2 mg/dL   GFR calc non Af Amer >60 >60 mL/min   GFR calc Af Amer >60 >60 mL/min    Comment: (NOTE) The eGFR has been calculated using the CKD EPI equation. This calculation has not been validated in all clinical situations. eGFR's persistently <60 mL/min signify possible Chronic Kidney Disease.    Anion gap 6 5 - 15  Ethanol     Status: None   Collection Time: 05/25/16  6:00 PM  Result Value Ref Range   Alcohol, Ethyl (B) <  5 <5 mg/dL    Comment:        LOWEST DETECTABLE LIMIT FOR SERUM ALCOHOL IS 5 mg/dL FOR MEDICAL PURPOSES ONLY   Urinalysis, Routine w reflex microscopic (not at [facility])     Status: Abnormal   Collection Time: 05/25/16  7:05 PM  Result Value Ref Range   Color, Urine YELLOW YELLOW   APPearance HAZY (A) CLEAR   Specific Gravity, Urine 1.020 1.005 - 1.030   pH 8.0 5.0 - 8.0   Glucose, UA NEGATIVE NEGATIVE mg/dL   Hgb urine dipstick NEGATIVE NEGATIVE   Bilirubin Urine NEGATIVE NEGATIVE   Ketones, ur NEGATIVE NEGATIVE mg/dL   Protein, ur NEGATIVE NEGATIVE mg/dL   Nitrite NEGATIVE NEGATIVE   Leukocytes, UA NEGATIVE NEGATIVE    Comment: MICROSCOPIC NOT DONE ON URINES WITH NEGATIVE PROTEIN, BLOOD, LEUKOCYTES, NITRITE, OR GLUCOSE <1000 mg/dL.  Urine rapid drug screen (hosp performed)     Status: None   Collection Time: 05/25/16  7:05 PM  Result Value Ref Range   Opiates NONE DETECTED NONE DETECTED   Cocaine NONE DETECTED NONE DETECTED   Benzodiazepines NONE DETECTED NONE DETECTED   Amphetamines NONE DETECTED NONE DETECTED   Tetrahydrocannabinol NONE DETECTED NONE DETECTED   Barbiturates NONE DETECTED NONE DETECTED    Comment:        DRUG SCREEN FOR MEDICAL PURPOSES ONLY.  IF CONFIRMATION IS NEEDED FOR ANY PURPOSE, NOTIFY LAB WITHIN 5 DAYS.        LOWEST DETECTABLE LIMITS FOR URINE DRUG SCREEN Drug Class       Cutoff (ng/mL) Amphetamine      1000 Barbiturate      200 Benzodiazepine   854 Tricyclics       627 Opiates           300 Cocaine          300 THC              50     Current Facility-Administered Medications  Medication Dose Route Frequency Provider Last Rate Last Dose  . citalopram (CELEXA) tablet 10 mg  10 mg Oral Daily Alva Broxson, MD      . traZODone (DESYREL) tablet 50 mg  50 mg Oral QHS PRN Corena Pilgrim, MD       Current Outpatient Prescriptions  Medication Sig Dispense Refill  . Calcium Carbonate-Vitamin D (CALTRATE 600+D PO) Take 1 tablet by mouth daily.    Marland Kitchen desonide (DESOWEN) 0.05 % ointment Apply 1 application topically daily as needed (for skin on face).     . Multiple Vitamin (MULTIVITAMIN WITH MINERALS) TABS tablet Take 1 tablet by mouth daily.    . citalopram (CELEXA) 10 MG tablet Take 1 tablet (10 mg total) by mouth daily. (Patient not taking: Reported on 05/25/2016) 30 tablet 0    Musculoskeletal: Strength & Muscle Tone: decreased Gait & Station: unsteady Patient leans: N/A  Psychiatric Specialty Exam: Physical Exam  Constitutional: She is oriented to person, place, and time. She appears well-nourished.  HENT:  Head: Normocephalic.  Neck: Normal range of motion.  Respiratory: Effort normal.  Musculoskeletal: Normal range of motion.  Neurological: She is alert and oriented to person, place, and time.  Skin: Skin is warm and dry.  Psychiatric: Judgment and thought content normal. Her speech is delayed. She is slowed. Cognition and memory are normal. She exhibits a depressed mood.    Review of Systems  Constitutional: Negative.   HENT: Negative.   Eyes: Negative.   Respiratory: Negative.  Cardiovascular: Negative.   Gastrointestinal: Negative.   Genitourinary: Negative.   Musculoskeletal: Negative.   Skin: Negative.   Neurological: Negative.   Endo/Heme/Allergies: Negative.   Psychiatric/Behavioral: Positive for depression.    Blood pressure 153/89, pulse 94, temperature 98.2 F (36.8 C), temperature source Oral, resp. rate 14, SpO2 100 %.There is no  weight on file to calculate BMI.  General Appearance: Casual  Eye Contact:  Fair  Speech:  Slow  Volume:  Decreased  Mood:  Depressed  Affect:  Congruent  Thought Process:  Coherent  Orientation:  Full (Time, Place, and Person)  Thought Content:  Rumination  Suicidal Thoughts:  Yes.  without intent/plan  Homicidal Thoughts:  No  Memory:  Immediate;   Fair Recent;   Fair Remote;   Fair  Judgement:  Impaired  Insight:  Fair  Psychomotor Activity:  Decreased  Concentration:  Concentration: Fair and Attention Span: Fair  Recall:  [employer] of Knowledge:  Good  Language:  Good  Akathisia:  No  Handed:  Right  AIMS (if indicated):     Assets:  Intimacy Leisure Time Resilience Social Support  ADL's:  Intact  Cognition:  WNL  Sleep:        Treatment Plan Summary: Daily contact with patient to assess and evaluate symptoms and progress in treatment, Medication management and Plan adjustment disorder with depressed mood:  -Crisis stabilization -Medication management:  Continue Celexa 10 mg daily for depression and her other medical medications.  Start Trazodone 50 mg at bedtime for sleep PRN. -Individual counseling  Disposition: Recommend psychiatric Inpatient admission when medically cleared.  Waylan Boga, NP 05/26/2016 12:24 PM Patient seen face-to-face for psychiatric evaluation, chart reviewed and case discussed with the physician extender and developed treatment plan. Reviewed the information documented and agree with the treatment plan. Corena Pilgrim, MD

## 2016-05-26 NOTE — Progress Notes (Signed)
[facility] with making a telephone call  ED CM approached him and greeted him.  He shook CM hand and asked who she was.  He began speaking about how he did not know what to do Reports he was told on "last night someone would come to see us.  A man and woman came to see us and said she will be going somewhere but I don't know where. I got these instructions and I'm trying to call"  Spouse pointed to the home health agency list Cm provided to him on last week that contained CM office number and [facility] home health agency number Cm discussed with the spouse that he and CM spoke about his instruction sheets last week but now he and pt are awaiting the response of geri-psychiatric bed search bed availability CM noted spouse recognition of Cm and his smile Then he said "now I remember", "thank you so much" and shook Cm hand and returned to pt bedside Cm discussed with the sitter at pt bedside that the spouse has noted memory issues and will need to be reminded that the pt is pending placement  Sitter voiced understanding and agreed to assist with reminding pt and spouse of pending disposition plans

## 2016-05-26 NOTE — Progress Notes (Addendum)
ED RN asked CM if CM "have" pt Informed her CM does not "have" pt prior to attending SAPPU progression  [facility]  Recommendation; IVC - Geripsychitry bed search - Pending response of bed search  ED CM [facility]  ED SW informed of husband interest in [facility] facility as left in his voice messages

## 2016-05-26 NOTE — ED Notes (Signed)
Bed: WA29 Expected date:  Expected time:  Means of arrival:  Comments:  Ed 21

## 2016-05-26 NOTE — Progress Notes (Signed)
This writer completed a chart review for disposition.    Raymont Andreoni, MSW, LCSW, LCAS [facility] Triage Specialist [phone] 

## 2016-05-26 NOTE — Progress Notes (Signed)
[facility], asking for patient's [facility] so [facility]  [facility] [phone] with confirmation of receipt.  No further EDCM needs at this time.

## 2016-05-26 NOTE — BH Assessment (Signed)
[facility] Assessment Progress Note  Per Thedore MinsMojeed Akintayo, MD, this pt requires psychiatric hospitalization at this time, and she meet criteria for IVC, which he has initiated.  [facility] [place] [facility] [facility], and at [employer]12:30 Magistrate [place] confirms receipt.  As of this writing, service of Findings and Custody Order is pending.  This Clinical research associatewriter will be seeking placement for pt.  Allison Canninghomas Dezra Mandella, MA Triage Specialist [phone]

## 2016-05-26 NOTE — Progress Notes (Addendum)
CSW received a message from ED CM stating patient's [facility] from a message left on ED CM voicemail. CSW spoke with Despina HiddenWhitney Stroud, Transitional Rehab Social Worker and she stated at the time their short term skilled, long term skilled, and the ALF are all full. CSW updated ED CM.   CSW left message for patient's husband to inform him that [facility] had no availability.  Elenore PaddyLaVonia Terrionna Bridwell, LCSWA [phone] ED CSW 05/26/2016 12:13 PM

## 2016-05-27 ENCOUNTER — Telehealth: Payer: Self-pay | Admitting: *Deleted

## 2016-05-27 NOTE — Telephone Encounter (Signed)
Notified kim

## 2016-05-27 NOTE — Telephone Encounter (Signed)
Please advise 

## 2016-05-27 NOTE — Telephone Encounter (Signed)
Very sorry, unable to accept back at this time

## 2016-05-27 NOTE — Telephone Encounter (Signed)
Error

## 2016-06-10 ENCOUNTER — Telehealth: Payer: Self-pay | Admitting: *Deleted

## 2016-09-11 ENCOUNTER — Ambulatory Visit (HOSPITAL_COMMUNITY): Payer: Medicare Other | Admitting: Psychiatry

## 2016-10-13 ENCOUNTER — Emergency Department (HOSPITAL_COMMUNITY): Payer: Medicare Other

## 2016-10-13 ENCOUNTER — Encounter (HOSPITAL_COMMUNITY): Payer: Self-pay | Admitting: Emergency Medicine

## 2016-10-13 ENCOUNTER — Inpatient Hospital Stay (HOSPITAL_COMMUNITY)
Admission: EM | Admit: 2016-10-13 | Discharge: 2016-10-15 | DRG: 208 | Disposition: E | Payer: Medicare Other | Attending: General Surgery | Admitting: General Surgery

## 2016-10-13 DIAGNOSIS — Z79899 Other long term (current) drug therapy: Secondary | ICD-10-CM

## 2016-10-13 DIAGNOSIS — D62 Acute posthemorrhagic anemia: Secondary | ICD-10-CM | POA: Diagnosis present

## 2016-10-13 DIAGNOSIS — I959 Hypotension, unspecified: Secondary | ICD-10-CM | POA: Diagnosis present

## 2016-10-13 DIAGNOSIS — D696 Thrombocytopenia, unspecified: Secondary | ICD-10-CM | POA: Diagnosis present

## 2016-10-13 DIAGNOSIS — S728X2A Other fracture of left femur, initial encounter for closed fracture: Secondary | ICD-10-CM | POA: Diagnosis present

## 2016-10-13 DIAGNOSIS — J9600 Acute respiratory failure, unspecified whether with hypoxia or hypercapnia: Secondary | ICD-10-CM | POA: Diagnosis not present

## 2016-10-13 DIAGNOSIS — S42202A Unspecified fracture of upper end of left humerus, initial encounter for closed fracture: Secondary | ICD-10-CM

## 2016-10-13 DIAGNOSIS — S72092A Other fracture of head and neck of left femur, initial encounter for closed fracture: Secondary | ICD-10-CM

## 2016-10-13 DIAGNOSIS — IMO0002 Reserved for concepts with insufficient information to code with codable children: Secondary | ICD-10-CM

## 2016-10-13 DIAGNOSIS — S7292XA Unspecified fracture of left femur, initial encounter for closed fracture: Secondary | ICD-10-CM | POA: Diagnosis present

## 2016-10-13 DIAGNOSIS — W1830XA Fall on same level, unspecified, initial encounter: Secondary | ICD-10-CM | POA: Diagnosis present

## 2016-10-13 DIAGNOSIS — M810 Age-related osteoporosis without current pathological fracture: Secondary | ICD-10-CM | POA: Diagnosis present

## 2016-10-13 DIAGNOSIS — R571 Hypovolemic shock: Secondary | ICD-10-CM | POA: Diagnosis present

## 2016-10-13 DIAGNOSIS — S3282XA Multiple fractures of pelvis without disruption of pelvic ring, initial encounter for closed fracture: Secondary | ICD-10-CM | POA: Diagnosis present

## 2016-10-13 DIAGNOSIS — Y92009 Unspecified place in unspecified non-institutional (private) residence as the place of occurrence of the external cause: Secondary | ICD-10-CM

## 2016-10-13 DIAGNOSIS — I469 Cardiac arrest, cause unspecified: Secondary | ICD-10-CM | POA: Diagnosis not present

## 2016-10-13 DIAGNOSIS — R531 Weakness: Secondary | ICD-10-CM | POA: Diagnosis present

## 2016-10-13 DIAGNOSIS — S42212A Unspecified displaced fracture of surgical neck of left humerus, initial encounter for closed fracture: Secondary | ICD-10-CM | POA: Diagnosis present

## 2016-10-13 DIAGNOSIS — W19XXXA Unspecified fall, initial encounter: Secondary | ICD-10-CM | POA: Diagnosis present

## 2016-10-13 DIAGNOSIS — Z66 Do not resuscitate: Secondary | ICD-10-CM | POA: Diagnosis present

## 2016-10-13 DIAGNOSIS — S42309A Unspecified fracture of shaft of humerus, unspecified arm, initial encounter for closed fracture: Secondary | ICD-10-CM | POA: Diagnosis present

## 2016-10-13 DIAGNOSIS — J9690 Respiratory failure, unspecified, unspecified whether with hypoxia or hypercapnia: Secondary | ICD-10-CM

## 2016-10-13 DIAGNOSIS — Z85828 Personal history of other malignant neoplasm of skin: Secondary | ICD-10-CM

## 2016-10-13 DIAGNOSIS — S32502A Unspecified fracture of left pubis, initial encounter for closed fracture: Secondary | ICD-10-CM

## 2016-10-13 DIAGNOSIS — T794XXA Traumatic shock, initial encounter: Secondary | ICD-10-CM

## 2016-10-13 LAB — BASIC METABOLIC PANEL
Anion gap: 12 (ref 5–15)
BUN: 24 mg/dL — AB (ref 6–20)
CALCIUM: 8.1 mg/dL — AB (ref 8.9–10.3)
CO2: 17 mmol/L — ABNORMAL LOW (ref 22–32)
CREATININE: 0.62 mg/dL (ref 0.44–1.00)
Chloride: 107 mmol/L (ref 101–111)
Glucose, Bld: 144 mg/dL — ABNORMAL HIGH (ref 65–99)
Potassium: 4.7 mmol/L (ref 3.5–5.1)
SODIUM: 136 mmol/L (ref 135–145)

## 2016-10-13 LAB — I-STAT CHEM 8, ED
BUN: 28 mg/dL — ABNORMAL HIGH (ref 6–20)
CREATININE: 0.6 mg/dL (ref 0.44–1.00)
Calcium, Ion: 1.11 mmol/L — ABNORMAL LOW (ref 1.15–1.40)
Chloride: 97 mmol/L — ABNORMAL LOW (ref 101–111)
Glucose, Bld: 157 mg/dL — ABNORMAL HIGH (ref 65–99)
HEMATOCRIT: 38 % (ref 36.0–46.0)
HEMOGLOBIN: 12.9 g/dL (ref 12.0–15.0)
POTASSIUM: 3.8 mmol/L (ref 3.5–5.1)
SODIUM: 139 mmol/L (ref 135–145)
TCO2: 31 mmol/L (ref 0–100)

## 2016-10-13 LAB — CBC WITH DIFFERENTIAL/PLATELET
BASOS PCT: 0 %
Basophils Absolute: 0 10*3/uL (ref 0.0–0.1)
EOS PCT: 0 %
Eosinophils Absolute: 0 10*3/uL (ref 0.0–0.7)
HEMATOCRIT: 44.8 % (ref 36.0–46.0)
Hemoglobin: 15.6 g/dL — ABNORMAL HIGH (ref 12.0–15.0)
LYMPHS ABS: 1.7 10*3/uL (ref 0.7–4.0)
Lymphocytes Relative: 8 %
MCH: 30.6 pg (ref 26.0–34.0)
MCHC: 34.8 g/dL (ref 30.0–36.0)
MCV: 87.8 fL (ref 78.0–100.0)
MONO ABS: 6.6 10*3/uL — AB (ref 0.1–1.0)
Monocytes Relative: 31 %
Neutro Abs: 12.9 10*3/uL — ABNORMAL HIGH (ref 1.7–7.7)
Neutrophils Relative %: 61 %
Platelets: 148 10*3/uL — ABNORMAL LOW (ref 150–400)
RBC: 5.1 MIL/uL (ref 3.87–5.11)
RDW: 13.5 % (ref 11.5–15.5)
WBC Morphology: INCREASED
WBC: 21.2 10*3/uL — ABNORMAL HIGH (ref 4.0–10.5)

## 2016-10-13 LAB — POCT I-STAT 3, ART BLOOD GAS (G3+)
Acid-base deficit: 3 mmol/L — ABNORMAL HIGH (ref 0.0–2.0)
Bicarbonate: 24.2 mmol/L (ref 20.0–28.0)
O2 Saturation: 83 %
PCO2 ART: 46.5 mmHg (ref 32.0–48.0)
PH ART: 7.32 — AB (ref 7.350–7.450)
TCO2: 26 mmol/L (ref 0–100)
pO2, Arterial: 50 mmHg — ABNORMAL LOW (ref 83.0–108.0)

## 2016-10-13 LAB — CBC
HCT: 38.4 % (ref 36.0–46.0)
Hemoglobin: 12.7 g/dL (ref 12.0–15.0)
MCH: 29.9 pg (ref 26.0–34.0)
MCHC: 33.1 g/dL (ref 30.0–36.0)
MCV: 90.4 fL (ref 78.0–100.0)
PLATELETS: 139 10*3/uL — AB (ref 150–400)
RBC: 4.25 MIL/uL (ref 3.87–5.11)
RDW: 12.7 % (ref 11.5–15.5)
WBC: 13.5 10*3/uL — ABNORMAL HIGH (ref 4.0–10.5)

## 2016-10-13 LAB — COMPREHENSIVE METABOLIC PANEL
ALBUMIN: 3.7 g/dL (ref 3.5–5.0)
ALT: 35 U/L (ref 14–54)
AST: 33 U/L (ref 15–41)
Alkaline Phosphatase: 87 U/L (ref 38–126)
Anion gap: 10 (ref 5–15)
BUN: 27 mg/dL — AB (ref 6–20)
CHLORIDE: 100 mmol/L — AB (ref 101–111)
CO2: 28 mmol/L (ref 22–32)
CREATININE: 0.58 mg/dL (ref 0.44–1.00)
Calcium: 8.8 mg/dL — ABNORMAL LOW (ref 8.9–10.3)
GFR calc Af Amer: >60 mL/min (ref >60)
GFR calc non Af Amer: >60 mL/min (ref >60)
GLUCOSE: 161 mg/dL — AB (ref 65–99)
Potassium: 3.8 mmol/L (ref 3.5–5.1)
SODIUM: 138 mmol/L (ref 135–145)
Total Bilirubin: 0.5 mg/dL (ref 0.3–1.2)
Total Protein: 6.1 g/dL — ABNORMAL LOW (ref 6.5–8.1)

## 2016-10-13 LAB — PREPARE FRESH FROZEN PLASMA
UNIT DIVISION: 0
Unit division: 0

## 2016-10-13 LAB — I-STAT TROPONIN, ED: TROPONIN I, POC: 0.03 ng/mL (ref 0.00–0.08)

## 2016-10-13 LAB — PROTIME-INR
INR: 0.98
Prothrombin Time: 12.9 seconds (ref 11.4–15.2)

## 2016-10-13 LAB — I-STAT CG4 LACTIC ACID, ED: Lactic Acid, Venous: 2.11 mmol/L (ref 0.5–1.9)

## 2016-10-13 LAB — ETHANOL: Alcohol, Ethyl (B): <5 mg/dL (ref <5)

## 2016-10-13 MED ORDER — ONDANSETRON HCL 4 MG PO TABS
4.0000 mg | ORAL_TABLET | Freq: Four times a day (QID) | ORAL | Status: DC | PRN
Start: 2016-10-13 — End: 2016-10-14

## 2016-10-13 MED ORDER — IOPAMIDOL (ISOVUE-300) INJECTION 61%
INTRAVENOUS | Status: AC
  Administered 2016-10-13: 75 mL
  Filled 2016-10-13: qty 75

## 2016-10-13 MED ORDER — LACTATED RINGERS IV SOLN
INTRAVENOUS | Status: DC | PRN
  Administered 2016-10-13: 1000 mL via INTRAVENOUS
  Administered 2016-10-13: 999 mL via INTRAVENOUS

## 2016-10-13 MED ORDER — HEPARIN SODIUM (PORCINE) 5000 UNIT/ML IJ SOLN
5000.0000 [IU] | Freq: Three times a day (TID) | INTRAMUSCULAR | Status: DC
  Administered 2016-10-14: 5000 [IU] via SUBCUTANEOUS
  Filled 2016-10-13: qty 1

## 2016-10-13 MED ORDER — ONDANSETRON HCL 4 MG/2ML IJ SOLN: 4.0000 mg | Freq: Four times a day (QID) | INTRAMUSCULAR | Status: DC | PRN

## 2016-10-13 MED ORDER — KETOROLAC TROMETHAMINE 15 MG/ML IJ SOLN
15.0000 mg | Freq: Four times a day (QID) | INTRAMUSCULAR | Status: DC | PRN
  Administered 2016-10-13 – 2016-10-14 (×2): 15 mg via INTRAVENOUS
  Filled 2016-10-13 (×2): qty 1

## 2016-10-13 MED ORDER — DEXTROSE-NACL 5-0.9 % IV SOLN
INTRAVENOUS | Status: DC
  Administered 2016-10-13 – 2016-10-14 (×2): via INTRAVENOUS

## 2016-10-13 MED ORDER — ENOXAPARIN SODIUM 40 MG/0.4ML ~~LOC~~ SOLN: 40.0000 mg | SUBCUTANEOUS | Status: DC

## 2016-10-13 MED ORDER — ACETAMINOPHEN 325 MG PO TABS
650.0000 mg | ORAL_TABLET | ORAL | Status: DC | PRN
Start: 2016-10-13 — End: 2016-10-14

## 2016-10-13 NOTE — Progress Notes (Signed)
   10/12/2016 2000  Clinical Encounter Type  Visited With Family  Visit Type Initial;ED;Trauma  Referral From Nurse  Spiritual Encounters  Spiritual Needs Emotional  [facility]; pt fell and likely fracture; husband in trauma room bedside; Ch spoke with husband and is available as needed; Husband indicated past history of depression. Allison Callahan 8:07 PM

## 2016-10-13 NOTE — H&P (Signed)
History   Allison Callahan is an 82 y.o. female.   Chief Complaint:  Chief Complaint  Patient presents with  . Fall    Pt is a 82 y/o F who was transferred from [place] as a same level fall.  Pt did not arrive in-coded and no page was sent out until after her arrival.  Per report pt was a fall from standing.  She was hypotensive at [facility] and was started on 2U of PRBCs.  No IVFs given.  Pt had labs drawn, CXR/pelvic films at [facility].  Prior to my arrival pt had a pelvic binder loosely placed at [facility] ED.  Pt c/o mainly pelvic/ LE pain.  She denies LOC or head trauma.   FAST neg x3; pelvic binder in place no pelvic window.   Fall  This is a new problem. The current episode started today. Pertinent negatives include no abdominal pain, chest pain, chills, coughing, fever, headaches, myalgias, nausea, neck pain, rash or vomiting. Nothing aggravates the symptoms. She has tried nothing for the symptoms.    Past Medical History:  Diagnosis Date  . Adult failure to thrive 08/08/2008   Qualifier: Diagnosis of  By: Allison Callahan, Allison Callahan 09/09/2007   Qualifier: Diagnosis of  By: Allison Callahan, Allison Callahan   . FRACTURE, WRIST 09/06/2007   Qualifier: Diagnosis of  By: Allison Callahan   . Osteoporosis   . PERIPHERAL NEUROPATHY 08/08/2008   Qualifier: Diagnosis of  By: Allison Callahan, Conde, THORACIC SPINE 12/29/2007   Qualifier: Diagnosis of  By: Allison Callahan, Allison Callahan     History reviewed. No pertinent surgical history.  History reviewed. No pertinent family history. Social History:  reports that she has never smoked. She has never used smokeless tobacco. She reports that she does not drink alcohol. Her drug history is not on file.  Allergies  No Known Allergies  Home Medications   (Not in a hospital admission)  Trauma Course   Results for orders placed or performed during the hospital encounter of 10/02/2016 (from the past 48 hour(s))  Type and  screen [place]     Status: None (Preliminary result)   Collection Time: 09/15/2016  7:08 PM  Result Value Ref Range   ABO/RH(D) O NEG    Antibody Screen NEG    Sample Expiration 10/16/2016    Unit Number [id number]    Blood Component Type RED CELLS,LR    Unit division 00    Status of Unit ISSUED    Transfusion Status PENDING    Crossmatch Result COMPATIBLE    Unit Number [id number]    Blood Component Type RED CELLS,LR    Unit division 00    Status of Unit ISSUED    Transfusion Status PENDING    Crossmatch Result COMPATIBLE   Comprehensive metabolic panel     Status: Abnormal   Collection Time: 09/19/2016  7:09 PM  Result Value Ref Range   Sodium 138 135 - 145 mmol/L   Potassium 3.8 3.5 - 5.1 mmol/L   Chloride 100 (L) 101 - 111 mmol/L   CO2 28 22 - 32 mmol/L   Glucose, Bld 161 (H) 65 - 99 mg/dL   BUN 27 (H) 6 - 20 mg/dL   Creatinine, Ser 0.58 0.44 - 1.00 mg/dL   Calcium 8.8 (L) 8.9 - 10.3 mg/dL   Total Protein 6.1 (L) 6.5 - 8.1 g/dL  Albumin 3.7 3.5 - 5.0 g/dL   AST 33 15 - 41 U/L   ALT 35 14 - 54 U/L   Alkaline Phosphatase 87 38 - 126 U/L   Total Bilirubin 0.5 0.3 - 1.2 mg/dL   GFR calc non Af Amer >60 >60 mL/min   GFR calc Af Amer >60 >60 mL/min    Comment: (NOTE) The eGFR has been calculated using the CKD EPI equation. This calculation has not been validated in all clinical situations. eGFR's persistently <60 mL/min signify possible Chronic Kidney Disease.    Anion gap 10 5 - 15  CBC     Status: Abnormal   Collection Time: 10/04/2016  7:09 PM  Result Value Ref Range   WBC 13.5 (H) 4.0 - 10.5 K/uL   RBC 4.25 3.87 - 5.11 MIL/uL   Hemoglobin 12.7 12.0 - 15.0 g/dL   HCT 38.4 36.0 - 46.0 %   MCV 90.4 78.0 - 100.0 fL   MCH 29.9 26.0 - 34.0 pg   MCHC 33.1 30.0 - 36.0 g/dL   RDW 12.7 11.5 - 15.5 %   Platelets 139 (L) 150 - 400 K/uL  Ethanol     Status: None   Collection Time: 10/04/2016  7:09 PM  Result Value Ref Range   Alcohol, Ethyl (B) <5  <5 mg/dL    Comment:        LOWEST DETECTABLE LIMIT FOR SERUM ALCOHOL IS 5 mg/dL FOR MEDICAL PURPOSES ONLY   Protime-INR     Status: None   Collection Time: 10/06/2016  7:09 PM  Result Value Ref Range   Prothrombin Time 12.9 11.4 - 15.2 seconds   INR 0.98   I-Stat Troponin, ED (not at [facility])     Status: None   Collection Time: 09/27/2016  7:23 PM  Result Value Ref Range   Troponin i, poc 0.03 0.00 - 0.08 ng/mL   Comment 3            Comment: Due to the release kinetics of cTnI, a negative result within the first hours of the onset of symptoms does not rule out myocardial infarction with certainty. If myocardial infarction is still suspected, repeat the test at appropriate intervals.   I-Stat Chem 8, ED     Status: Abnormal   Collection Time: 09/19/2016  7:24 PM  Result Value Ref Range   Sodium 139 135 - 145 mmol/L   Potassium 3.8 3.5 - 5.1 mmol/L   Chloride 97 (L) 101 - 111 mmol/L   BUN 28 (H) 6 - 20 mg/dL   Creatinine, Ser 0.60 0.44 - 1.00 mg/dL   Glucose, Bld 157 (H) 65 - 99 mg/dL   Calcium, Ion 1.11 (L) 1.15 - 1.40 mmol/L   TCO2 31 0 - 100 mmol/L   Hemoglobin 12.9 12.0 - 15.0 g/dL   HCT 38.0 36.0 - 46.0 %  I-Stat CG4 Lactic Acid, ED     Status: Abnormal   Collection Time: 09/27/2016  7:25 PM  Result Value Ref Range   Lactic Acid, Venous 2.11 (HH) 0.5 - 1.9 mmol/L   Comment NOTIFIED PHYSICIAN   Prepare fresh frozen plasma     Status: None (Preliminary result)   Collection Time: 09/20/2016  7:51 PM  Result Value Ref Range   Unit Number [id number]    Blood Component Type LIQ PLASMA    Unit division 00    Status of Unit ISSUED    Unit tag comment VERBAL ORDERS PER DR [facility]    Transfusion Status  OK TO TRANSFUSE    Unit Number [id number]    Blood Component Type LIQ PLASMA    Unit division 00    Status of Unit ISSUED    Unit tag comment VERBAL ORDERS PER DR CARDAMA    Transfusion Status OK TO TRANSFUSE   Type and screen     Status: None (Preliminary result)    Collection Time: 09/20/2016  7:51 PM  Result Value Ref Range   ABO/RH(D) PENDING    Antibody Screen PENDING    Sample Expiration 10/16/2016    Unit Number [id number]    Blood Component Type RBC LR PHER2    Unit division 00    Status of Unit ISSUED    Unit tag comment VERBAL ORDERS PER DR CARDAMA    Transfusion Status OK TO TRANSFUSE    Crossmatch Result PENDING    Unit Number [id number]    Blood Component Type RBC LR PHER1    Unit division 00    Status of Unit ISSUED    Unit tag comment VERBAL ORDERS PER DR CARDAMA    Transfusion Status OK TO TRANSFUSE    Crossmatch Result PENDING    Ct Chest W Contrast  Result Date: 10/04/2016 CLINICAL DATA:  Fall at home.  Pelvic fractures along radiography. EXAM: CT CHEST, ABDOMEN, AND PELVIS WITH CONTRAST TECHNIQUE: Multidetector CT imaging of the chest, abdomen and pelvis was performed following the standard protocol during bolus administration of intravenous contrast. CONTRAST:  70m ISOVUE-300 IOPAMIDOL (ISOVUE-300) INJECTION 61% COMPARISON:  Pelvic radiograph of 10/06/2016 FINDINGS: CT CHEST FINDINGS Cardiovascular: Markedly severe tortuosity of the thoracic aorta. Moderate cardiomegaly especially with right heart enlargement. Standard delay resulted in early arterial phase images suggesting reduced cardiac output. I do not see an aortic dissection. Mediastinum/Nodes: No pathologic adenopathy. Lungs/Pleura: Mild dependent atelectasis in both lower lobes. Biapical pleuroparenchymal scarring. Azygos fissure. No pneumothorax or significant pleural effusion. Musculoskeletal: Surgical neck fracture of the left humerus with at least 2.7 cm displacement of the humeral head fragment with respect to the shaft, and with the humeral head having only a continuous articulation with the glenoid. There may be a separate greater tuberosity fragment which appears displaced and this could be a three-part fracture. This fracture appears acute. Old sternal manubrial  fracture with resulting deformity. There compression fractures at T4, T5, T6, T7, T8, T 9, T10, and T12. Vertebral augmentation at T8. Of these fractures, the T4, T5, T6 fractures appear to of intervally occurred compared to 2010 but are still thought to be old. The T12 compression fracture is stable and has a stable amount of associated posterior bony retropulsion. There rib deformities bilaterally which are thought to be due to old fractures. Retro areolar left breast mass and left upper breast mass, possibly hematoma or malignancy, asymmetric from the contralateral side. CT ABDOMEN PELVIS FINDINGS Hepatobiliary: Periportal edema noted on the delayed images. I do not see a definite hepatic laceration or perihepatic ascites, with the understanding that there is poor definition of fat planes in the abdomen as well as poor signal to noise ratio from streak artifact related to the patient's arms. Also the initial pass was performed in the early arterial phase, causing heterogeneous enhancement in the liver and spleen which reduced diagnostic sensitivity and specificity. Gallbladder not well seen, possibly removed or contracted. Pancreas: Unremarkable, better seen on the delayed images. Spleen: Unremarkable Adrenals/Urinary Tract: Unremarkable Stomach/Bowel: Unremarkable Vascular/Lymphatic: Unremarkable Reproductive: Grossly unremarkable Other: Low-level mesenteric edema is suspected Musculoskeletal: The acute transverse fracture of the  left pubic body with involvement of the superior and inferior ramus. Acute nondisplaced right anterior acetabular wall fracture, as on image 42/4. He acute subcapital left femoral neck fracture. Severe chondral thinning in both hips. Segmental fracture of the left inferior pubic ramus likely with acute and chronic components. Acute fracture of the left sacral ala vertically. Questionable fracture the right sacral ala. Bony demineralization. Pelvic fractures are associated with  thickening of the left operator internus muscle but possibly a small amount of hemorrhage in the space of Retzius. However we do not show active extravasation of contrast. With respect to the lumbar spine, there are superior endplate compression fractures at L2 and L5 which are probably chronic. No subluxation identified. IMPRESSION: 1. A variety of acute pelvic fractures, common notably fractures of the left pubic body and pubic rami; the left and possibly right sacral ala; and the right acetabular wall. No active extravasation although there is probably a small amount of hematoma in the space of Retzius. 2. Old compression fractures in the spine at T4, T5, T6, T7, T8, T 9, T10, T12, L 2, and L5. Old healed rib fractures. 3. Acute left subcapital femoral neck fracture. 4. Acute left surgical neck fracture of the humerus, probably 2 part, possibly 3 part. 5. Moderate cardiomegaly with right heart enlargement and probably reduced cardiac output. Markedly severe tortuosity of the thoracic aorta. 6. Bibasilar atelectasis in the lungs. 7. Old sternal manubrial fracture. 8. Left retro areolar and upper breast mass, possibly hematoma or malignancy. Correlate with breast exam. 9. Periportal edema in the liver. There has reduced sensitivity in assessing the solid organs due to a poor signal to noise ratio related to a variety of technical factors. Reviewed at the workstation with trauma surgery. Electronically Signed   By: Van Clines M.D.   On: 09/30/2016 21:04   Ct Abdomen Pelvis W Contrast  Result Date: 10/12/2016 CLINICAL DATA:  Fall at home.  Pelvic fractures along radiography. EXAM: CT CHEST, ABDOMEN, AND PELVIS WITH CONTRAST TECHNIQUE: Multidetector CT imaging of the chest, abdomen and pelvis was performed following the standard protocol during bolus administration of intravenous contrast. CONTRAST:  48m ISOVUE-300 IOPAMIDOL (ISOVUE-300) INJECTION 61% COMPARISON:  Pelvic radiograph of 09/21/2016  FINDINGS: CT CHEST FINDINGS Cardiovascular: Markedly severe tortuosity of the thoracic aorta. Moderate cardiomegaly especially with right heart enlargement. Standard delay resulted in early arterial phase images suggesting reduced cardiac output. I do not see an aortic dissection. Mediastinum/Nodes: No pathologic adenopathy. Lungs/Pleura: Mild dependent atelectasis in both lower lobes. Biapical pleuroparenchymal scarring. Azygos fissure. No pneumothorax or significant pleural effusion. Musculoskeletal: Surgical neck fracture of the left humerus with at least 2.7 cm displacement of the humeral head fragment with respect to the shaft, and with the humeral head having only a continuous articulation with the glenoid. There may be a separate greater tuberosity fragment which appears displaced and this could be a three-part fracture. This fracture appears acute. Old sternal manubrial fracture with resulting deformity. There compression fractures at T4, T5, T6, T7, T8, T 9, T10, and T12. Vertebral augmentation at T8. Of these fractures, the T4, T5, T6 fractures appear to of intervally occurred compared to 2010 but are still thought to be old. The T12 compression fracture is stable and has a stable amount of associated posterior bony retropulsion. There rib deformities bilaterally which are thought to be due to old fractures. Retro areolar left breast mass and left upper breast mass, possibly hematoma or malignancy, asymmetric from the contralateral side. CT ABDOMEN PELVIS  FINDINGS Hepatobiliary: Periportal edema noted on the delayed images. I do not see a definite hepatic laceration or perihepatic ascites, with the understanding that there is poor definition of fat planes in the abdomen as well as poor signal to noise ratio from streak artifact related to the patient's arms. Also the initial pass was performed in the early arterial phase, causing heterogeneous enhancement in the liver and spleen which reduced diagnostic  sensitivity and specificity. Gallbladder not well seen, possibly removed or contracted. Pancreas: Unremarkable, better seen on the delayed images. Spleen: Unremarkable Adrenals/Urinary Tract: Unremarkable Stomach/Bowel: Unremarkable Vascular/Lymphatic: Unremarkable Reproductive: Grossly unremarkable Other: Low-level mesenteric edema is suspected Musculoskeletal: The acute transverse fracture of the left pubic body with involvement of the superior and inferior ramus. Acute nondisplaced right anterior acetabular wall fracture, as on image 42/4. He acute subcapital left femoral neck fracture. Severe chondral thinning in both hips. Segmental fracture of the left inferior pubic ramus likely with acute and chronic components. Acute fracture of the left sacral ala vertically. Questionable fracture the right sacral ala. Bony demineralization. Pelvic fractures are associated with thickening of the left operator internus muscle but possibly a small amount of hemorrhage in the space of Retzius. However we do not show active extravasation of contrast. With respect to the lumbar spine, there are superior endplate compression fractures at L2 and L5 which are probably chronic. No subluxation identified. IMPRESSION: 1. A variety of acute pelvic fractures, common notably fractures of the left pubic body and pubic rami; the left and possibly right sacral ala; and the right acetabular wall. No active extravasation although there is probably a small amount of hematoma in the space of Retzius. 2. Old compression fractures in the spine at T4, T5, T6, T7, T8, T 9, T10, T12, L 2, and L5. Old healed rib fractures. 3. Acute left subcapital femoral neck fracture. 4. Acute left surgical neck fracture of the humerus, probably 2 part, possibly 3 part. 5. Moderate cardiomegaly with right heart enlargement and probably reduced cardiac output. Markedly severe tortuosity of the thoracic aorta. 6. Bibasilar atelectasis in the lungs. 7. Old sternal  manubrial fracture. 8. Left retro areolar and upper breast mass, possibly hematoma or malignancy. Correlate with breast exam. 9. Periportal edema in the liver. There has reduced sensitivity in assessing the solid organs due to a poor signal to noise ratio related to a variety of technical factors. Reviewed at the workstation with trauma surgery. Electronically Signed   By: Van Clines M.D.   On: 09/30/2016 21:04   Dg Pelvis Portable  Result Date: 10/12/2016 CLINICAL DATA:  Unwitnessed fall, severe pain in the center of the low pelvis EXAM: PORTABLE PELVIS 1-2 VIEWS COMPARISON:  None. FINDINGS: Bones appear osteopenic. Moderate to marked degenerative changes of the right hip. Right femoral head demonstrates normal positioning. Right inferior and superior pubic rami appear grossly normal. Probable fractures of the left superior and inferior pubic rami at the pubic symphysis. There is questionable step-off deformity at the left femoral head neck junction. Left femoral head does not appear dislocated. IMPRESSION: 1. Suspected fracture lucencies through the left pubic symphysis and superior pubic rami and likely inferior pubic ramus. 2. Possible step-off deformity at the left femoral head neck junction, could attempt dedicated views left hip, otherwise CT may be obtained for further evaluation. Electronically Signed   By: Donavan Foil M.D.   On: 10/09/2016 19:26   Dg Chest Portable 1 View  Result Date: 10/06/2016 CLINICAL DATA:  Status post fall.  Shortness  of breath. EXAM: PORTABLE CHEST 1 VIEW COMPARISON:  A chest radiograph 05/26/2016, chest CT 12/01/2009 FINDINGS: There is marked scoliosis of the thoracic spine. The aorta is ectatic, as demonstrated on prior CT. There is no pneumothorax or sizable pleural effusion. There are linear opacities in the left lung base, likely atelectasis, and unchanged compared to prior chest radiograph. No pulmonary edema or focal airspace consolidation. There is  vertebral augmentation material seen within 1 of the mid thoracic vertebrae. IMPRESSION: No focal airspace disease or pulmonary edema. Linear opacities at the left lung base, likely atelectasis, are unchanged compared to 05/26/2016. Electronically Signed   By: Ulyses Jarred M.D.   On: 09/17/2016 19:27    Review of Systems  Constitutional: Negative for chills, fever, malaise/fatigue and weight loss.  HENT: Negative for ear discharge, ear pain, hearing loss, nosebleeds and tinnitus.   Eyes: Negative for blurred vision, double vision, photophobia and pain.  Respiratory: Negative for cough, hemoptysis, sputum production and wheezing.   Cardiovascular: Negative for chest pain, palpitations, orthopnea and claudication.  Gastrointestinal: Negative for abdominal pain, diarrhea, heartburn, nausea and vomiting.  Genitourinary: Negative for dysuria, frequency and urgency.  Musculoskeletal: Positive for falls and joint pain. Negative for back pain, myalgias and neck pain.  Skin: Negative for itching and rash.  Neurological: Negative for dizziness, tingling, tremors, sensory change and headaches.  All other systems reviewed and are negative.   Blood pressure 92/67, pulse 98, temperature 97.5 F (36.4 C), resp. rate 26, weight 34 kg (75 lb), SpO2 93 %. Physical Exam  Constitutional: She appears well-developed. She appears distressed (2/2 to pain).  HENT:  Head: Normocephalic and atraumatic.  Right Ear: External ear normal.  Left Ear: External ear normal.  Eyes: Conjunctivae and EOM are normal. Pupils are equal, round, and reactive to light. Right eye exhibits no discharge. Left eye exhibits no discharge. No scleral icterus.  Neck: Normal range of motion. Neck supple. No JVD present. No tracheal deviation present. No thyromegaly present.  Cardiovascular: Normal rate, regular rhythm, normal heart sounds and intact distal pulses.  Exam reveals no gallop and no friction rub.   No murmur  heard. Respiratory: Effort normal and breath sounds normal. No stridor. No respiratory distress. She has no wheezes. She has no rales. She exhibits no tenderness.  GI: Soft. Bowel sounds are normal. She exhibits no distension and no mass. There is no tenderness. There is no rebound and no guarding.  Musculoskeletal: Normal range of motion. She exhibits no edema or deformity.       Left shoulder: She exhibits tenderness.  Neurological: She is alert.  Skin: Skin is warm and dry. She is not diaphoretic.     Assessment/Plan 82 y/o F s/p same level fall 1. Mult pelvic fxs 2. L femoral fx 3. L humeral fx 4. Mult old thoracic spinal fxs  1. Admit to SDU/ICU 2. Dr. Rolena Infante to eval from Ortho perspective 3. IVF, NPO   Rosario Jacks., Evadne Ose 10/01/2016, 9:08 PM   Procedures

## 2016-10-13 NOTE — ED Notes (Addendum)
Per MD no CT until stable BP-BP remains 74/46

## 2016-10-13 NOTE — ED Provider Notes (Signed)
[facility]-EMERGENCY DEPT Provider Note   CSN: [id number] Arrival date & time: 11/05/16  1832     History   Chief Complaint Chief Complaint  Patient presents with  . Fall    HPI Allison Callahan is a 82 y.o. female.  HPI Patient presented as a transfer from [facility] for hypertension in the setting of fall from standing resulting in pelvic fractures, left femoral and left humeral fractures. Patient receiving 2 units of packed red blood cells. Sent here for trauma evaluation.  In route, EMS reported the patient had improved blood pressures. Place blood pressure with systolics in the low 100s.  On arrival, ABC's intact. Patient is alert in obvious discomfort. However given the fact that the patient is receiving packed red blood cells and was a level I trauma at [facility], level I trauma code was initiated upon arrival.  Difficult to obtain history from the patient due to her discomfort and disorientation. Peripheral history was obtained by the husband who reports that he found her on the floor after he heard her crying out for help. He believes that she slipped on slick flooring.  Past Medical History:  Diagnosis Date  . Adult failure to thrive 08/08/2008   Qualifier: Diagnosis of  By: Jonny Ruiz MD, Len Blalock   . CLOSTRIDIUM DIFFICILE COLITIS 09/09/2007   Qualifier: Diagnosis of  By: Jonny Ruiz MD, Len Blalock   . FRACTURE, WRIST 09/06/2007   Qualifier: Diagnosis of  By: Maris Berger   . Osteoporosis   . PERIPHERAL NEUROPATHY 08/08/2008   Qualifier: Diagnosis of  By: Jonny Ruiz MD, Len Blalock   . VERTEBRAL FRACTURE, THORACIC SPINE 12/29/2007   Qualifier: Diagnosis of  By: Jonny Ruiz MD, Len Blalock     Patient Active Problem List   Diagnosis Date Noted  . Fall 11-05-2016  . Adjustment disorder with depressed mood 10/07/2014  . HYPERLIPIDEMIA 09/09/2007  . OSTEOPOROSIS 09/09/2007  . HEADACHE 09/09/2007  . SKIN CANCER, HX OF 09/06/2007    History reviewed. No pertinent surgical history.  OB  History    No data available       Home Medications    Prior to Admission medications   Medication Sig Start Date End Date Taking? Authorizing Provider  Calcium Carbonate-Vitamin D (CALTRATE 600+D PO) Take 1 tablet by mouth daily.   Yes Historical Provider, MD  Multiple Vitamin (MULTIVITAMIN WITH MINERALS) TABS tablet Take 1 tablet by mouth daily.   Yes Historical Provider, MD  citalopram (CELEXA) 10 MG tablet Take 1 tablet (10 mg total) by mouth daily. Patient not taking: Reported on 11-05-16 05/21/16   Charm Rings, NP  desonide (DESOWEN) 0.05 % ointment Apply 1 application topically daily as needed (for skin on face).     Historical Provider, MD    Family History History reviewed. No pertinent family history.  Social History Social History  Substance Use Topics  . Smoking status: Never Smoker  . Smokeless tobacco: Never Used  . Alcohol use No     Allergies   Review of patient's allergies indicates no known allergies.   Review of Systems Review of Systems  Unable to perform ROS: Mental status change     Physical Exam Updated Vital Signs BP 104/73   Pulse (!) 117   Temp 97.5 F (36.4 C) (Axillary)   Resp (!) 31   Ht 5' (1.524 m)   Wt 75 lb (34 kg)   SpO2 97%   BMI 14.65 kg/m   Physical Exam  Constitutional:  She is oriented to person, place, and time. She appears well-developed and well-nourished. No distress.  In obvious discomfort  HENT:  Head: Normocephalic and atraumatic.  Right Ear: External ear normal.  Left Ear: External ear normal.  Nose: Nose normal.  Eyes: Conjunctivae and EOM are normal. Pupils are equal, round, and reactive to light. Right eye exhibits no discharge. Left eye exhibits no discharge. No scleral icterus.  Neck: Normal range of motion. Neck supple.  Cardiovascular: Normal rate, regular rhythm and normal heart sounds.  Exam reveals no gallop and no friction rub.   No murmur heard. Pulses:      Radial pulses are 2+ on the right  side, and 2+ on the left side.       Dorsalis pedis pulses are 2+ on the right side, and 2+ on the left side.  Pulmonary/Chest: Effort normal and breath sounds normal. No stridor. No respiratory distress. She has no wheezes.  Abdominal: Soft. She exhibits no distension. There is no tenderness.  Musculoskeletal: She exhibits no edema or tenderness.       Cervical back: She exhibits no bony tenderness.       Thoracic back: She exhibits no bony tenderness.       Lumbar back: She exhibits no bony tenderness.  Clavicles stable. Chest stable to AP/Lat compression. Left shoulder and left wrist deformity, significant tenderness over shoulder. Left pelvis significant tenderness with instability  Neurological: She is alert and oriented to person, place, and time.  Moving all extremities  Skin: Skin is warm and dry. No rash noted. She is not diaphoretic. No erythema.  Psychiatric: She has a normal mood and affect.     ED Treatments / Results  Labs (all labs ordered are listed, but only abnormal results are displayed) Labs Reviewed  COMPREHENSIVE METABOLIC PANEL - Abnormal; Notable for the following:       Result Value   Chloride 100 (*)    Glucose, Bld 161 (*)    BUN 27 (*)    Calcium 8.8 (*)    Total Protein 6.1 (*)    All other components within normal limits  CBC - Abnormal; Notable for the following:    WBC 13.5 (*)    Platelets 139 (*)    All other components within normal limits  CBC WITH DIFFERENTIAL/PLATELET - Abnormal; Notable for the following:    WBC 21.2 (*)    Hemoglobin 15.6 (*)    Platelets 148 (*)    Neutro Abs 12.9 (*)    Monocytes Absolute 6.6 (*)    All other components within normal limits  BASIC METABOLIC PANEL - Abnormal; Notable for the following:    CO2 17 (*)    Glucose, Bld 144 (*)    BUN 24 (*)    Calcium 8.1 (*)    All other components within normal limits  I-STAT CHEM 8, ED - Abnormal; Notable for the following:    Chloride 97 (*)    BUN 28 (*)     Glucose, Bld 157 (*)    Calcium, Ion 1.11 (*)    All other components within normal limits  I-STAT CG4 LACTIC ACID, ED - Abnormal; Notable for the following:    Lactic Acid, Venous 2.11 (*)    All other components within normal limits  POCT I-STAT 3, ART BLOOD GAS (G3+) - Abnormal; Notable for the following:    pH, Arterial 7.320 (*)    pO2, Arterial 50.0 (*)    Acid-base deficit 3.0 (*)  All other components within normal limits  ETHANOL  PROTIME-INR  CDS SEROLOGY  URINALYSIS, ROUTINE W REFLEX MICROSCOPIC (NOT AT [facility])  CBC  COMPREHENSIVE METABOLIC PANEL  BLOOD GAS, ARTERIAL  I-STAT TROPOININ, ED  TYPE AND SCREEN  PREPARE FRESH FROZEN PLASMA  ABO/RH    EKG  EKG Interpretation  Date/Time:  Monday October 13 2016 19:26:06 EDT Ventricular Rate:  96 PR Interval:    QRS Duration: 102 QT Interval:  364 QTC Calculation: 460 R Axis:   100 Text Interpretation:  Sinus rhythm Right axis deviation Low voltage, precordial leads Borderline T abnormalities, anterior leads Baseline wander in lead(s) V3 Confirmed by KNOTT MD, DANIEL ([id number]) on 07-Jul-2016 8:09:05 PM       Radiology Ct Head Wo Contrast  Result Date: 07-Jul-2016 CLINICAL DATA:  Fall at home with pelvic fractures, left shoulder fracture, left hip fracture. EXAM: CT HEAD WITHOUT CONTRAST CT CERVICAL SPINE WITHOUT CONTRAST TECHNIQUE: Multidetector CT imaging of the head and cervical spine was performed following the standard protocol without intravenous contrast. Multiplanar CT image reconstructions of the cervical spine were also generated. COMPARISON:  10/06/2014 FINDINGS: CT HEAD FINDINGS Brain: The brainstem, cerebellum, cerebral peduncles, thalami, basal ganglia, basilar cisterns, and ventricular system appear within normal limits. No intracranial hemorrhage, mass lesion, or acute CVA. Vascular: Unremarkable Skull: Unremarkable Sinuses/Orbits: Prominent bilateral superior ophthalmic veins incidentally noted. Other: Tiny  amount of gas in venous structures of the face, considered incidental. Bilateral temporomandibular joint arthropathy, left greater than right. CT CERVICAL SPINE FINDINGS Alignment: Exaggerated cervical lordosis. 1.5 mm degenerative retrolisthesis at C2-3. Skull base and vertebrae: There is loss of the predental space due to degenerative arthropathy with associated spurring. Given the degree of motion artifact, I do not see a definite fracture. The anterior portion of C7 is excluded on these images but is included on the CT chest images and appears intact. This exclusion is due to the severity of the cervical lordosis. Soft tissues and spinal canal:  Unremarkable Disc levels: Uncinate and facet spurring cause osseous foraminal narrowing on the left at C4-5 and C5-6 , and on the right at C4-5 and C5-6 as well. Upper chest: Biapical pleuroparenchymal scarring. Other: No supplemental non-categorized findings. IMPRESSION: 1. No acute intracranial findings and no acute cervical spine findings. 2. Bilateral osseous foraminal stenosis at C4-5 and C5-6 due to spurring. Images reviewed at the workstation with trauma surgery. Electronically Signed   By: Gaylyn RongWalter  Liebkemann M.D.   On: 07-Jul-2016 21:13   Ct Chest W Contrast  Result Date: 07-Jul-2016 CLINICAL DATA:  Fall at home.  Pelvic fractures along radiography. EXAM: CT CHEST, ABDOMEN, AND PELVIS WITH CONTRAST TECHNIQUE: Multidetector CT imaging of the chest, abdomen and pelvis was performed following the standard protocol during bolus administration of intravenous contrast. CONTRAST:  75mL ISOVUE-300 IOPAMIDOL (ISOVUE-300) INJECTION 61% COMPARISON:  Pelvic radiograph of 07-Jul-2016 FINDINGS: CT CHEST FINDINGS Cardiovascular: Markedly severe tortuosity of the thoracic aorta. Moderate cardiomegaly especially with right heart enlargement. Standard delay resulted in early arterial phase images suggesting reduced cardiac output. I do not see an aortic dissection.  Mediastinum/Nodes: No pathologic adenopathy. Lungs/Pleura: Mild dependent atelectasis in both lower lobes. Biapical pleuroparenchymal scarring. Azygos fissure. No pneumothorax or significant pleural effusion. Musculoskeletal: Surgical neck fracture of the left humerus with at least 2.7 cm displacement of the humeral head fragment with respect to the shaft, and with the humeral head having only a continuous articulation with the glenoid. There may be a separate greater tuberosity fragment which appears displaced and  this could be a three-part fracture. This fracture appears acute. Old sternal manubrial fracture with resulting deformity. There compression fractures at T4, T5, T6, T7, T8, T 9, T10, and T12. Vertebral augmentation at T8. Of these fractures, the T4, T5, T6 fractures appear to of intervally occurred compared to 2010 but are still thought to be old. The T12 compression fracture is stable and has a stable amount of associated posterior bony retropulsion. There rib deformities bilaterally which are thought to be due to old fractures. Retro areolar left breast mass and left upper breast mass, possibly hematoma or malignancy, asymmetric from the contralateral side. CT ABDOMEN PELVIS FINDINGS Hepatobiliary: Periportal edema noted on the delayed images. I do not see a definite hepatic laceration or perihepatic ascites, with the understanding that there is poor definition of fat planes in the abdomen as well as poor signal to noise ratio from streak artifact related to the patient's arms. Also the initial pass was performed in the early arterial phase, causing heterogeneous enhancement in the liver and spleen which reduced diagnostic sensitivity and specificity. Gallbladder not well seen, possibly removed or contracted. Pancreas: Unremarkable, better seen on the delayed images. Spleen: Unremarkable Adrenals/Urinary Tract: Unremarkable Stomach/Bowel: Unremarkable Vascular/Lymphatic: Unremarkable Reproductive:  Grossly unremarkable Other: Low-level mesenteric edema is suspected Musculoskeletal: The acute transverse fracture of the left pubic body with involvement of the superior and inferior ramus. Acute nondisplaced right anterior acetabular wall fracture, as on image 42/4. He acute subcapital left femoral neck fracture. Severe chondral thinning in both hips. Segmental fracture of the left inferior pubic ramus likely with acute and chronic components. Acute fracture of the left sacral ala vertically. Questionable fracture the right sacral ala. Bony demineralization. Pelvic fractures are associated with thickening of the left operator internus muscle but possibly a small amount of hemorrhage in the space of Retzius. However we do not show active extravasation of contrast. With respect to the lumbar spine, there are superior endplate compression fractures at L2 and L5 which are probably chronic. No subluxation identified. IMPRESSION: 1. A variety of acute pelvic fractures, common notably fractures of the left pubic body and pubic rami; the left and possibly right sacral ala; and the right acetabular wall. No active extravasation although there is probably a small amount of hematoma in the space of Retzius. 2. Old compression fractures in the spine at T4, T5, T6, T7, T8, T 9, T10, T12, L 2, and L5. Old healed rib fractures. 3. Acute left subcapital femoral neck fracture. 4. Acute left surgical neck fracture of the humerus, probably 2 part, possibly 3 part. 5. Moderate cardiomegaly with right heart enlargement and probably reduced cardiac output. Markedly severe tortuosity of the thoracic aorta. 6. Bibasilar atelectasis in the lungs. 7. Old sternal manubrial fracture. 8. Left retro areolar and upper breast mass, possibly hematoma or malignancy. Correlate with breast exam. 9. Periportal edema in the liver. There has reduced sensitivity in assessing the solid organs due to a poor signal to noise ratio related to a variety of  technical factors. Reviewed at the workstation with trauma surgery. Electronically Signed   By: Gaylyn Rong M.D.   On: 06-Nov-2016 21:04   Ct Cervical Spine Wo Contrast  Result Date: November 06, 2016 CLINICAL DATA:  Fall at home with pelvic fractures, left shoulder fracture, left hip fracture. EXAM: CT HEAD WITHOUT CONTRAST CT CERVICAL SPINE WITHOUT CONTRAST TECHNIQUE: Multidetector CT imaging of the head and cervical spine was performed following the standard protocol without intravenous contrast. Multiplanar CT image reconstructions of  the cervical spine were also generated. COMPARISON:  10/06/2014 FINDINGS: CT HEAD FINDINGS Brain: The brainstem, cerebellum, cerebral peduncles, thalami, basal ganglia, basilar cisterns, and ventricular system appear within normal limits. No intracranial hemorrhage, mass lesion, or acute CVA. Vascular: Unremarkable Skull: Unremarkable Sinuses/Orbits: Prominent bilateral superior ophthalmic veins incidentally noted. Other: Tiny amount of gas in venous structures of the face, considered incidental. Bilateral temporomandibular joint arthropathy, left greater than right. CT CERVICAL SPINE FINDINGS Alignment: Exaggerated cervical lordosis. 1.5 mm degenerative retrolisthesis at C2-3. Skull base and vertebrae: There is loss of the predental space due to degenerative arthropathy with associated spurring. Given the degree of motion artifact, I do not see a definite fracture. The anterior portion of C7 is excluded on these images but is included on the CT chest images and appears intact. This exclusion is due to the severity of the cervical lordosis. Soft tissues and spinal canal:  Unremarkable Disc levels: Uncinate and facet spurring cause osseous foraminal narrowing on the left at C4-5 and C5-6 , and on the right at C4-5 and C5-6 as well. Upper chest: Biapical pleuroparenchymal scarring. Other: No supplemental non-categorized findings. IMPRESSION: 1. No acute intracranial findings and  no acute cervical spine findings. 2. Bilateral osseous foraminal stenosis at C4-5 and C5-6 due to spurring. Images reviewed at the workstation with trauma surgery. Electronically Signed   By: Gaylyn Rong M.D.   On: 09/18/2016 21:13   Ct Abdomen Pelvis W Contrast  Result Date: 10/10/2016 CLINICAL DATA:  Fall at home.  Pelvic fractures along radiography. EXAM: CT CHEST, ABDOMEN, AND PELVIS WITH CONTRAST TECHNIQUE: Multidetector CT imaging of the chest, abdomen and pelvis was performed following the standard protocol during bolus administration of intravenous contrast. CONTRAST:  75mL ISOVUE-300 IOPAMIDOL (ISOVUE-300) INJECTION 61% COMPARISON:  Pelvic radiograph of 10/13/2016 FINDINGS: CT CHEST FINDINGS Cardiovascular: Markedly severe tortuosity of the thoracic aorta. Moderate cardiomegaly especially with right heart enlargement. Standard delay resulted in early arterial phase images suggesting reduced cardiac output. I do not see an aortic dissection. Mediastinum/Nodes: No pathologic adenopathy. Lungs/Pleura: Mild dependent atelectasis in both lower lobes. Biapical pleuroparenchymal scarring. Azygos fissure. No pneumothorax or significant pleural effusion. Musculoskeletal: Surgical neck fracture of the left humerus with at least 2.7 cm displacement of the humeral head fragment with respect to the shaft, and with the humeral head having only a continuous articulation with the glenoid. There may be a separate greater tuberosity fragment which appears displaced and this could be a three-part fracture. This fracture appears acute. Old sternal manubrial fracture with resulting deformity. There compression fractures at T4, T5, T6, T7, T8, T 9, T10, and T12. Vertebral augmentation at T8. Of these fractures, the T4, T5, T6 fractures appear to of intervally occurred compared to 2010 but are still thought to be old. The T12 compression fracture is stable and has a stable amount of associated posterior bony  retropulsion. There rib deformities bilaterally which are thought to be due to old fractures. Retro areolar left breast mass and left upper breast mass, possibly hematoma or malignancy, asymmetric from the contralateral side. CT ABDOMEN PELVIS FINDINGS Hepatobiliary: Periportal edema noted on the delayed images. I do not see a definite hepatic laceration or perihepatic ascites, with the understanding that there is poor definition of fat planes in the abdomen as well as poor signal to noise ratio from streak artifact related to the patient's arms. Also the initial pass was performed in the early arterial phase, causing heterogeneous enhancement in the liver and spleen which reduced diagnostic sensitivity  and specificity. Gallbladder not well seen, possibly removed or contracted. Pancreas: Unremarkable, better seen on the delayed images. Spleen: Unremarkable Adrenals/Urinary Tract: Unremarkable Stomach/Bowel: Unremarkable Vascular/Lymphatic: Unremarkable Reproductive: Grossly unremarkable Other: Low-level mesenteric edema is suspected Musculoskeletal: The acute transverse fracture of the left pubic body with involvement of the superior and inferior ramus. Acute nondisplaced right anterior acetabular wall fracture, as on image 42/4. He acute subcapital left femoral neck fracture. Severe chondral thinning in both hips. Segmental fracture of the left inferior pubic ramus likely with acute and chronic components. Acute fracture of the left sacral ala vertically. Questionable fracture the right sacral ala. Bony demineralization. Pelvic fractures are associated with thickening of the left operator internus muscle but possibly a small amount of hemorrhage in the space of Retzius. However we do not show active extravasation of contrast. With respect to the lumbar spine, there are superior endplate compression fractures at L2 and L5 which are probably chronic. No subluxation identified. IMPRESSION: 1. A variety of acute pelvic  fractures, common notably fractures of the left pubic body and pubic rami; the left and possibly right sacral ala; and the right acetabular wall. No active extravasation although there is probably a small amount of hematoma in the space of Retzius. 2. Old compression fractures in the spine at T4, T5, T6, T7, T8, T 9, T10, T12, L 2, and L5. Old healed rib fractures. 3. Acute left subcapital femoral neck fracture. 4. Acute left surgical neck fracture of the humerus, probably 2 part, possibly 3 part. 5. Moderate cardiomegaly with right heart enlargement and probably reduced cardiac output. Markedly severe tortuosity of the thoracic aorta. 6. Bibasilar atelectasis in the lungs. 7. Old sternal manubrial fracture. 8. Left retro areolar and upper breast mass, possibly hematoma or malignancy. Correlate with breast exam. 9. Periportal edema in the liver. There has reduced sensitivity in assessing the solid organs due to a poor signal to noise ratio related to a variety of technical factors. Reviewed at the workstation with trauma surgery. Electronically Signed   By: Gaylyn Rong M.D.   On: 09/28/2016 21:04   Dg Pelvis Portable  Result Date: 10/05/2016 CLINICAL DATA:  Unwitnessed fall, severe pain in the center of the low pelvis EXAM: PORTABLE PELVIS 1-2 VIEWS COMPARISON:  None. FINDINGS: Bones appear osteopenic. Moderate to marked degenerative changes of the right hip. Right femoral head demonstrates normal positioning. Right inferior and superior pubic rami appear grossly normal. Probable fractures of the left superior and inferior pubic rami at the pubic symphysis. There is questionable step-off deformity at the left femoral head neck junction. Left femoral head does not appear dislocated. IMPRESSION: 1. Suspected fracture lucencies through the left pubic symphysis and superior pubic rami and likely inferior pubic ramus. 2. Possible step-off deformity at the left femoral head neck junction, could attempt  dedicated views left hip, otherwise CT may be obtained for further evaluation. Electronically Signed   By: Jasmine Pang M.D.   On: 10/10/2016 19:26   Dg Chest Portable 1 View  Result Date: 10/11/2016 CLINICAL DATA:  Status post fall.  Shortness of breath. EXAM: PORTABLE CHEST 1 VIEW COMPARISON:  A chest radiograph 05/26/2016, chest CT 12/01/2009 FINDINGS: There is marked scoliosis of the thoracic spine. The aorta is ectatic, as demonstrated on prior CT. There is no pneumothorax or sizable pleural effusion. There are linear opacities in the left lung base, likely atelectasis, and unchanged compared to prior chest radiograph. No pulmonary edema or focal airspace consolidation. There is vertebral augmentation material seen  within 1 of the mid thoracic vertebrae. IMPRESSION: No focal airspace disease or pulmonary edema. Linear opacities at the left lung base, likely atelectasis, are unchanged compared to 05/26/2016. Electronically Signed   By: Deatra Robinson M.D.   On: 09/28/2016 19:27    Procedures Procedures (including critical care time)  Medications Ordered in ED Medications  dextrose 5 %-0.9 % sodium chloride infusion ( Intravenous Transfusing/Transfer 09/17/2016 2231)  acetaminophen (TYLENOL) tablet 650 mg (not administered)  ondansetron (ZOFRAN) tablet 4 mg (not administered)    Or  ondansetron (ZOFRAN) injection 4 mg (not administered)  ketorolac (TORADOL) 15 MG/ML injection 15 mg (15 mg Intravenous Given 10/05/2016 2201)  heparin injection 5,000 Units (not administered)  iopamidol (ISOVUE-300) 61 % injection (75 mLs  Contrast Given 10/12/2016 2000)     Initial Impression / Assessment and Plan / ED Course  I have reviewed the triage vital signs and the nursing notes.  Pertinent labs & imaging results that were available during my care of the patient were reviewed by me and considered in my medical decision making (see chart for details).  Clinical Course    Pelvic binder applied upon  arrival. Trauma bedside who requested IV fluid boluses for blood pressure control. Hemoglobin at [facility] within normal limits. We'll send repeat here. Trauma CTs obtained here confirming left humeral, left femoral, and pelvic fractures. No evidence of active extravasation noted. Also consulted who will follow along. Patient admitted to trauma for continued management.  Final Clinical Impressions(s) / ED Diagnoses   Final diagnoses:  Closed fracture of left pubis, unspecified portion of pubis, initial encounter [facility])  Traumatic hemorrhagic shock, initial encounter (HCC)  Unspecified displaced fracture of surgical neck of left humerus, initial encounter for closed fracture  Other closed fracture of head or neck of left femur, initial encounter [facility])      Nira Conn, MD 11-02-2016 0127

## 2016-10-13 NOTE — ED Notes (Signed)
Escorted to CT by this RN and trauma team. 

## 2016-10-13 NOTE — ED Notes (Signed)
Bed: [id number] Expected date:  Expected time:  Means of arrival:  Comments: EMS- 82yo F, fall/L arm pain

## 2016-10-13 NOTE — ED Notes (Addendum)
CN and MD made aware of VS patient moved to resus B. Placed on 15L via [place].  Dr. Clydene PughKnott made aware.  Code trauma called.  Per MD emergency release 2 units of blood.

## 2016-10-13 NOTE — ED Notes (Signed)
Paged orthopedic surgery to MD Cardama

## 2016-10-13 NOTE — ED Notes (Signed)
XRAY and MD at bedside.

## 2016-10-13 NOTE — Progress Notes (Signed)
Orthopedic Tech Progress Note Patient Details:  Allison Callahan August 23, 1930 [id number]  Ortho Devices Type of Ortho Device: Arm sling Ortho Device/Splint Location: lue Ortho Device/Splint Interventions: Ordered, Application   [name], [name] 02-19-2016, 11:13 PM

## 2016-10-13 NOTE — ED Triage Notes (Signed)
Per EMS patient fell at home approx 2 hours PTA with injury to left arm.  Patient initially complained of pain to left humerus, shoulder but has obvious deformity to left forearm, wrist.  Denies LOC.

## 2016-10-13 NOTE — ED Notes (Signed)
Attempted in and out cath x 2, unsuccessful.  

## 2016-10-13 NOTE — ED Provider Notes (Signed)
[facility]-EMERGENCY DEPT Provider Note   CSN: [id number] Arrival date & time: 10/27/2016  1832     History   Chief Complaint Chief Complaint  Patient presents with  . Fall    HPI Allison Callahan is a 82 y.o. female.  The history is provided by the patient.  Fall  This is a new problem. The current episode started 1 to 2 hours ago. The problem occurs constantly. The problem has not changed since onset.Associated symptoms comments: Left arm pain and left hip pain. Nothing aggravates the symptoms. Nothing relieves the symptoms. She has tried nothing for the symptoms.    Past Medical History:  Diagnosis Date  . Adult failure to thrive 08/08/2008   Qualifier: Diagnosis of  By: Jonny Ruiz MD, Len Blalock   . CLOSTRIDIUM DIFFICILE COLITIS 09/09/2007   Qualifier: Diagnosis of  By: Jonny Ruiz MD, Len Blalock   . FRACTURE, WRIST 09/06/2007   Qualifier: Diagnosis of  By: Maris Berger   . Osteoporosis   . PERIPHERAL NEUROPATHY 08/08/2008   Qualifier: Diagnosis of  By: Jonny Ruiz MD, Len Blalock   . VERTEBRAL FRACTURE, THORACIC SPINE 12/29/2007   Qualifier: Diagnosis of  By: Jonny Ruiz MD, Len Blalock     Patient Active Problem List   Diagnosis Date Noted  . Adjustment disorder with depressed mood 10/07/2014  . HYPERLIPIDEMIA 09/09/2007  . OSTEOPOROSIS 09/09/2007  . HEADACHE 09/09/2007  . SKIN CANCER, HX OF 09/06/2007    History reviewed. No pertinent surgical history.  OB History    No data available       Home Medications    Prior to Admission medications   Medication Sig Start Date End Date Taking? Authorizing Provider  Calcium Carbonate-Vitamin D (CALTRATE 600+D PO) Take 1 tablet by mouth daily.    Historical Provider, MD  citalopram (CELEXA) 10 MG tablet Take 1 tablet (10 mg total) by mouth daily. Patient not taking: Reported on 05/25/2016 05/21/16   Charm Rings, NP  desonide (DESOWEN) 0.05 % ointment Apply 1 application topically daily as needed (for skin on face).     Historical Provider, MD    Multiple Vitamin (MULTIVITAMIN WITH MINERALS) TABS tablet Take 1 tablet by mouth daily.    Historical Provider, MD    Family History History reviewed. No pertinent family history.  Social History Social History  Substance Use Topics  . Smoking status: Never Smoker  . Smokeless tobacco: Never Used  . Alcohol use No     Allergies   Review of patient's allergies indicates no known allergies.   Review of Systems Review of Systems  Unable to perform ROS: Acuity of condition     Physical Exam Updated Vital Signs Pulse 92   Temp 97.7 F (36.5 C) (Axillary)   Resp 20   Wt 75 lb (34 kg)   SpO2 99%   BMI 13.72 kg/m   Physical Exam  Constitutional: She appears well-developed and well-nourished. She appears distressed (in pain ).  HENT:  Head: Normocephalic and atraumatic.  Nose: Nose normal.  Eyes: Conjunctivae are normal.  Neck: Neck supple. No tracheal deviation present.  Cardiovascular: Regular rhythm.  Tachycardia present.   Pulmonary/Chest: Tachypnea noted. She has no decreased breath sounds.  Abdominal: Soft. She exhibits no distension.  Musculoskeletal:  Left shoulder and left wrist deformity, significant tenderness over shoulder. Left pelvis significant tenderness with instability   Neurological: She is disoriented. GCS eye subscore is 3. GCS verbal subscore is 4. GCS motor subscore is 5.  Moving all  4 extremities  Skin: Skin is warm and dry.  Psychiatric: She has a normal mood and affect.  Vitals reviewed.    ED Treatments / Results  Labs (all labs ordered are listed, but only abnormal results are displayed) Labs Reviewed  COMPREHENSIVE METABOLIC PANEL - Abnormal; Notable for the following:       Result Value   Chloride 100 (*)    Glucose, Bld 161 (*)    BUN 27 (*)    Calcium 8.8 (*)    Total Protein 6.1 (*)    All other components within normal limits  CBC - Abnormal; Notable for the following:    WBC 13.5 (*)    Platelets 139 (*)    All other  components within normal limits  I-STAT CHEM 8, ED - Abnormal; Notable for the following:    Chloride 97 (*)    BUN 28 (*)    Glucose, Bld 157 (*)    Calcium, Ion 1.11 (*)    All other components within normal limits  I-STAT CG4 LACTIC ACID, ED - Abnormal; Notable for the following:    Lactic Acid, Venous 2.11 (*)    All other components within normal limits  ETHANOL  PROTIME-INR  CDS SEROLOGY  URINALYSIS, ROUTINE W REFLEX MICROSCOPIC (NOT AT [facility])  I-STAT TROPOININ, ED  TYPE AND SCREEN  PREPARE FRESH FROZEN PLASMA  TYPE AND SCREEN    EKG  EKG Interpretation  Date/Time:  Monday October 13 2016 19:26:06 EDT Ventricular Rate:  96 PR Interval:    QRS Duration: 102 QT Interval:  364 QTC Calculation: 460 R Axis:   100 Text Interpretation:  Sinus rhythm Right axis deviation Low voltage, precordial leads Borderline T abnormalities, anterior leads Baseline wander in lead(s) V3 Confirmed by Loyde Orth MD, Merlin Golden ([id number]) on 09/18/2016 8:09:05 PM       Radiology Dg Pelvis Portable  Result Date: 09/19/2016 CLINICAL DATA:  Unwitnessed fall, severe pain in the center of the low pelvis EXAM: PORTABLE PELVIS 1-2 VIEWS COMPARISON:  None. FINDINGS: Bones appear osteopenic. Moderate to marked degenerative changes of the right hip. Right femoral head demonstrates normal positioning. Right inferior and superior pubic rami appear grossly normal. Probable fractures of the left superior and inferior pubic rami at the pubic symphysis. There is questionable step-off deformity at the left femoral head neck junction. Left femoral head does not appear dislocated. IMPRESSION: 1. Suspected fracture lucencies through the left pubic symphysis and superior pubic rami and likely inferior pubic ramus. 2. Possible step-off deformity at the left femoral head neck junction, could attempt dedicated views left hip, otherwise CT may be obtained for further evaluation. Electronically Signed   By: Jasmine PangKim  Fujinaga M.D.   On:  09/24/2016 19:26   Dg Chest Portable 1 View  Result Date: 10/03/2016 CLINICAL DATA:  Status post fall.  Shortness of breath. EXAM: PORTABLE CHEST 1 VIEW COMPARISON:  A chest radiograph 05/26/2016, chest CT 12/01/2009 FINDINGS: There is marked scoliosis of the thoracic spine. The aorta is ectatic, as demonstrated on prior CT. There is no pneumothorax or sizable pleural effusion. There are linear opacities in the left lung base, likely atelectasis, and unchanged compared to prior chest radiograph. No pulmonary edema or focal airspace consolidation. There is vertebral augmentation material seen within 1 of the mid thoracic vertebrae. IMPRESSION: No focal airspace disease or pulmonary edema. Linear opacities at the left lung base, likely atelectasis, are unchanged compared to 05/26/2016. Electronically Signed   By: Deatra RobinsonKevin  Herman M.D.   On:  03-Apr-2016 19:27    Procedures Procedures (including critical care time)  CRITICAL CARE Performed by: Lyndal PulleyKnott, Maclaine Ahola Total critical care time: 30 minutes Critical care time was exclusive of separately billable procedures and treating other patients. Critical care was necessary to treat or prevent imminent or life-threatening deterioration. Critical care was time spent personally by me on the following activities: development of treatment plan with patient and/or surrogate as well as nursing, discussions with consultants, evaluation of patient's response to treatment, examination of patient, obtaining history from patient or surrogate, ordering and performing treatments and interventions, ordering and review of laboratory studies, ordering and review of radiographic studies, pulse oximetry and re-evaluation of patient's condition.  Emergency Focused Ultrasound Exam Limited Ultrasound of the Abdomen and Pericardium (FAST Exam)  Performed and interpreted by Dr. Clydene PughKnott Indication: Trauma Multiple views of the abdomen and pericardium are obtained with a multi-frequency  probe. Findings: no anechoic fluid in abdomen, no anechoic fluid surrounding heart Interpretation: no hemoperitoneum, no pericardial effusion, without tamponade Images archived electronically.  CPT Codes: cardiac [phone], abdomen [phone] (study includes both codes)   Medications Ordered in ED Medications - No data to display   Initial Impression / Assessment and Plan / ED Course  I have reviewed the triage vital signs and the nursing notes.  Pertinent labs & imaging results that were available during my care of the patient were reviewed by me and considered in my medical decision making (see chart for details).  Clinical Course    82 y.o. female presents with Fall at home and per husband was in her normal state of health, fell to the ground in immediate pain and has since become increasingly confused and weak. She arrives very pale, hypotensive to 70s over 50s systolic. Initial workup revealed pelvic fractures that are concerning for a source of bleeding and presumed left humeral fracture on exam with chronic left wrist deformity.  Patient is critically ill and no trauma surgery availability here, FAST was negative so Pt will need CT scans which were ordered. After discussion with surgery it was felt the patient would benefit from transfer to a trauma center. 2u unmatched PRBC started and [facility] after sheet placed for pelvis stabilization. Accepted by Dr Eudelia Bunchardama, decision was made to not delay transport for scans as transport is available now and these can be done on arrival if patient is deemed stable.   Final Clinical Impressions(s) / ED Diagnoses   Final diagnoses:  Closed fracture of left pubis, unspecified portion of pubis, initial encounter [facility](HCC)  Traumatic hemorrhagic shock, initial encounter [facility](HCC)    New Prescriptions New Prescriptions   No medications on file     Lyndal Pulleyaniel Azion Centrella, MD 07/20/2016 2010

## 2016-10-13 NOTE — Consult Note (Signed)
No primary care provider on file. Chief Complaint: fall History: Pt is a 82 y/o F who [facility] as a same level fall.  Pt did not arrive in-coded and no page was sent out until after her arrival.  Per report pt was a fall from standing.  [facility] and was started on 2U of PRBCs.  No IVFs given.  Pt had labs drawn, CXR/pel[facility] films [facility].  Prior to my arrival pt had a pelvic binder loosely [facility].  Pt c/o mainly pelvic/ LE pain.  She denies LOC or head trauma.   FAST neg x3; pelvic binder in place no pelvic window. Past Medical History:  Diagnosis Date  . Adult failure to thrive 08/08/2008   Qualifier: Diagnosis of  By: [name], [name]   . CLOSTRIDIUM DIFFICILE COLITIS 09/09/2007   Qualifier: Diagnosis of  By: [name], [name]   . FRACTURE, WRIST 09/06/2007   Qualifier: Diagnosis of  By: [name], [name]   . Osteoporosis   . PERIPHERAL NEUROPATHY 08/08/2008   Qualifier: Diagnosis of  By: [name], [name]   . VERTEBRAL FRACTURE, THORACIC SPINE 12/29/2007   Qualifier: Diagnosis of  By: [name], [name]     No Known Allergies  No current facility-administered medications on file prior to encounter.    Current Outpatient Prescriptions on File Prior to Encounter  Medication Sig Dispense Refill  . Calcium Carbonate-Vitamin D (CALTRATE 600+D PO) Take 1 tablet by mouth daily.    . Multiple Vitamin (MULTIVITAMIN WITH MINERALS) TABS tablet Take 1 tablet by mouth daily.    . citalopram (CELEXA) 10 MG tablet Take 1 tablet (10 mg total) by mouth daily. (Patient not taking: Reported on 2016/10/12) 30 tablet 0  . desonide (DESOWEN) 0.05 % ointment Apply 1 application topically daily as needed (for skin on face).       Physical Exam: Vitals:   25-Oct-2016 2119 25-Oct-2016 2145  BP: 91/59 (!) 84/59  Pulse: 60 111  Resp: (!) 27 26  Temp:    A+O X2 (person and place) No SOB/CP abd soft/NT Left UE: no laceration/abrasion.  Tender to  palpation proximal humerus. No gross deformity or dislocation noted Sensation to LT intact throughout UE Pelvis: diffuse tenderness to palpation Compartments soft/NT 1+ DP/PT pulses Positive left hip pain with palpation  Image: Ct Head Wo Contrast  Result Date: 2016/10/12 CLINICAL DATA:  Fall at home with pelvic fractures, left shoulder fracture, left hip fracture. EXAM: CT HEAD WITHOUT CONTRAST CT CERVICAL SPINE WITHOUT CONTRAST TECHNIQUE: Multidetector CT imaging of the head and cervical spine was performed following the standard protocol without intravenous contrast. Multiplanar CT image reconstructions of the cervical spine were also generated. COMPARISON:  10/06/2014 FINDINGS: CT HEAD FINDINGS Brain: The brainstem, cerebellum, cerebral peduncles, thalami, basal ganglia, basilar cisterns, and ventricular system appear within normal limits. No intracranial hemorrhage, mass lesion, or acute CVA. Vascular: Unremarkable Skull: Unremarkable Sinuses/Orbits: Prominent bilateral superior ophthalmic veins incidentally noted. Other: Tiny amount of gas in venous structures of the face, considered incidental. Bilateral temporomandibular joint arthropathy, left greater than right. CT CERVICAL SPINE FINDINGS Alignment: Exaggerated cervical lordosis. 1.5 mm degenerative retrolisthesis at C2-3. Skull base and vertebrae: There is loss of the predental space due to degenerative arthropathy with associated spurring. Given the degree of motion artifact, I do not see a definite fracture. The anterior portion of C7 is excluded on these images but is included on the  CT chest images and appears intact. This exclusion is due to the severity of the cervical lordosis. Soft tissues and spinal canal:  Unremarkable Disc levels: Uncinate and facet spurring cause osseous foraminal narrowing on the left at C4-5 and C5-6 , and on the right at C4-5 and C5-6 as well. Upper chest: Biapical pleuroparenchymal scarring. Other: No  supplemental non-categorized findings. IMPRESSION: 1. No acute intracranial findings and no acute cervical spine findings. 2. Bilateral osseous foraminal stenosis at C4-5 and C5-6 due to spurring. Images reviewed at the workstation with trauma surgery. Electronically Signed   By: Gaylyn Rong M.D.   On: 10/03/2016 21:13   Ct Chest W Contrast  Result Date: 09/29/2016 CLINICAL DATA:  Fall at home.  Pelvic fractures along radiography. EXAM: CT CHEST, ABDOMEN, AND PELVIS WITH CONTRAST TECHNIQUE: Multidetector CT imaging of the chest, abdomen and pelvis was performed following the standard protocol during bolus administration of intravenous contrast. CONTRAST:  75mL ISOVUE-300 IOPAMIDOL (ISOVUE-300) INJECTION 61% COMPARISON:  Pelvic radiograph of 09/28/2016 FINDINGS: CT CHEST FINDINGS Cardiovascular: Markedly severe tortuosity of the thoracic aorta. Moderate cardiomegaly especially with right heart enlargement. Standard delay resulted in early arterial phase images suggesting reduced cardiac output. I do not see an aortic dissection. Mediastinum/Nodes: No pathologic adenopathy. Lungs/Pleura: Mild dependent atelectasis in both lower lobes. Biapical pleuroparenchymal scarring. Azygos fissure. No pneumothorax or significant pleural effusion. Musculoskeletal: Surgical neck fracture of the left humerus with at least 2.7 cm displacement of the humeral head fragment with respect to the shaft, and with the humeral head having only a continuous articulation with the glenoid. There may be a separate greater tuberosity fragment which appears displaced and this could be a three-part fracture. This fracture appears acute. Old sternal manubrial fracture with resulting deformity. There compression fractures at T4, T5, T6, T7, T8, T 9, T10, and T12. Vertebral augmentation at T8. Of these fractures, the T4, T5, T6 fractures appear to of intervally occurred compared to 2010 but are still thought to be old. The T12 compression  fracture is stable and has a stable amount of associated posterior bony retropulsion. There rib deformities bilaterally which are thought to be due to old fractures. Retro areolar left breast mass and left upper breast mass, possibly hematoma or malignancy, asymmetric from the contralateral side. CT ABDOMEN PELVIS FINDINGS Hepatobiliary: Periportal edema noted on the delayed images. I do not see a definite hepatic laceration or perihepatic ascites, with the understanding that there is poor definition of fat planes in the abdomen as well as poor signal to noise ratio from streak artifact related to the patient's arms. Also the initial pass was performed in the early arterial phase, causing heterogeneous enhancement in the liver and spleen which reduced diagnostic sensitivity and specificity. Gallbladder not well seen, possibly removed or contracted. Pancreas: Unremarkable, better seen on the delayed images. Spleen: Unremarkable Adrenals/Urinary Tract: Unremarkable Stomach/Bowel: Unremarkable Vascular/Lymphatic: Unremarkable Reproductive: Grossly unremarkable Other: Low-level mesenteric edema is suspected Musculoskeletal: The acute transverse fracture of the left pubic body with involvement of the superior and inferior ramus. Acute nondisplaced right anterior acetabular wall fracture, as on image 42/4. He acute subcapital left femoral neck fracture. Severe chondral thinning in both hips. Segmental fracture of the left inferior pubic ramus likely with acute and chronic components. Acute fracture of the left sacral ala vertically. Questionable fracture the right sacral ala. Bony demineralization. Pelvic fractures are associated with thickening of the left operator internus muscle but possibly a small amount of hemorrhage in the space of Retzius. However  we do not show active extravasation of contrast. With respect to the lumbar spine, there are superior endplate compression fractures at L2 and L5 which are probably  chronic. No subluxation identified. IMPRESSION: 1. A variety of acute pelvic fractures, common notably fractures of the left pubic body and pubic rami; the left and possibly right sacral ala; and the right acetabular wall. No active extravasation although there is probably a small amount of hematoma in the space of Retzius. 2. Old compression fractures in the spine at T4, T5, T6, T7, T8, T 9, T10, T12, L 2, and L5. Old healed rib fractures. 3. Acute left subcapital femoral neck fracture. 4. Acute left surgical neck fracture of the humerus, probably 2 part, possibly 3 part. 5. Moderate cardiomegaly with right heart enlargement and probably reduced cardiac output. Markedly severe tortuosity of the thoracic aorta. 6. Bibasilar atelectasis in the lungs. 7. Old sternal manubrial fracture. 8. Left retro areolar and upper breast mass, possibly hematoma or malignancy. Correlate with breast exam. 9. Periportal edema in the liver. There has reduced sensitivity in assessing the solid organs due to a poor signal to noise ratio related to a variety of technical factors. Reviewed at the workstation with trauma surgery. Electronically Signed   By: Gaylyn Rong M.D.   On: 10-26-16 21:04   Ct Cervical Spine Wo Contrast  Result Date: 10-26-16 CLINICAL DATA:  Fall at home with pelvic fractures, left shoulder fracture, left hip fracture. EXAM: CT HEAD WITHOUT CONTRAST CT CERVICAL SPINE WITHOUT CONTRAST TECHNIQUE: Multidetector CT imaging of the head and cervical spine was performed following the standard protocol without intravenous contrast. Multiplanar CT image reconstructions of the cervical spine were also generated. COMPARISON:  10/06/2014 FINDINGS: CT HEAD FINDINGS Brain: The brainstem, cerebellum, cerebral peduncles, thalami, basal ganglia, basilar cisterns, and ventricular system appear within normal limits. No intracranial hemorrhage, mass lesion, or acute CVA. Vascular: Unremarkable Skull: Unremarkable  Sinuses/Orbits: Prominent bilateral superior ophthalmic veins incidentally noted. Other: Tiny amount of gas in venous structures of the face, considered incidental. Bilateral temporomandibular joint arthropathy, left greater than right. CT CERVICAL SPINE FINDINGS Alignment: Exaggerated cervical lordosis. 1.5 mm degenerative retrolisthesis at C2-3. Skull base and vertebrae: There is loss of the predental space due to degenerative arthropathy with associated spurring. Given the degree of motion artifact, I do not see a definite fracture. The anterior portion of C7 is excluded on these images but is included on the CT chest images and appears intact. This exclusion is due to the severity of the cervical lordosis. Soft tissues and spinal canal:  Unremarkable Disc levels: Uncinate and facet spurring cause osseous foraminal narrowing on the left at C4-5 and C5-6 , and on the right at C4-5 and C5-6 as well. Upper chest: Biapical pleuroparenchymal scarring. Other: No supplemental non-categorized findings. IMPRESSION: 1. No acute intracranial findings and no acute cervical spine findings. 2. Bilateral osseous foraminal stenosis at C4-5 and C5-6 due to spurring. Images reviewed at the workstation with trauma surgery. Electronically Signed   By: Gaylyn Rong M.D.   On: 10/26/16 21:13   Ct Abdomen Pelvis W Contrast  Result Date: 10-26-2016 CLINICAL DATA:  Fall at home.  Pelvic fractures along radiography. EXAM: CT CHEST, ABDOMEN, AND PELVIS WITH CONTRAST TECHNIQUE: Multidetector CT imaging of the chest, abdomen and pelvis was performed following the standard protocol during bolus administration of intravenous contrast. CONTRAST:  75mL ISOVUE-300 IOPAMIDOL (ISOVUE-300) INJECTION 61% COMPARISON:  Pelvic radiograph of 26-Oct-2016 FINDINGS: CT CHEST FINDINGS Cardiovascular: Markedly severe tortuosity of  the thoracic aorta. Moderate cardiomegaly especially with right heart enlargement. Standard delay resulted in early  arterial phase images suggesting reduced cardiac output. I do not see an aortic dissection. Mediastinum/Nodes: No pathologic adenopathy. Lungs/Pleura: Mild dependent atelectasis in both lower lobes. Biapical pleuroparenchymal scarring. Azygos fissure. No pneumothorax or significant pleural effusion. Musculoskeletal: Surgical neck fracture of the left humerus with at least 2.7 cm displacement of the humeral head fragment with respect to the shaft, and with the humeral head having only a continuous articulation with the glenoid. There may be a separate greater tuberosity fragment which appears displaced and this could be a three-part fracture. This fracture appears acute. Old sternal manubrial fracture with resulting deformity. There compression fractures at T4, T5, T6, T7, T8, T 9, T10, and T12. Vertebral augmentation at T8. Of these fractures, the T4, T5, T6 fractures appear to of intervally occurred compared to 2010 but are still thought to be old. The T12 compression fracture is stable and has a stable amount of associated posterior bony retropulsion. There rib deformities bilaterally which are thought to be due to old fractures. Retro areolar left breast mass and left upper breast mass, possibly hematoma or malignancy, asymmetric from the contralateral side. CT ABDOMEN PELVIS FINDINGS Hepatobiliary: Periportal edema noted on the delayed images. I do not see a definite hepatic laceration or perihepatic ascites, with the understanding that there is poor definition of fat planes in the abdomen as well as poor signal to noise ratio from streak artifact related to the patient's arms. Also the initial pass was performed in the early arterial phase, causing heterogeneous enhancement in the liver and spleen which reduced diagnostic sensitivity and specificity. Gallbladder not well seen, possibly removed or contracted. Pancreas: Unremarkable, better seen on the delayed images. Spleen: Unremarkable Adrenals/Urinary Tract:  Unremarkable Stomach/Bowel: Unremarkable Vascular/Lymphatic: Unremarkable Reproductive: Grossly unremarkable Other: Low-level mesenteric edema is suspected Musculoskeletal: The acute transverse fracture of the left pubic body with involvement of the superior and inferior ramus. Acute nondisplaced right anterior acetabular wall fracture, as on image 42/4. He acute subcapital left femoral neck fracture. Severe chondral thinning in both hips. Segmental fracture of the left inferior pubic ramus likely with acute and chronic components. Acute fracture of the left sacral ala vertically. Questionable fracture the right sacral ala. Bony demineralization. Pelvic fractures are associated with thickening of the left operator internus muscle but possibly a small amount of hemorrhage in the space of Retzius. However we do not show active extravasation of contrast. With respect to the lumbar spine, there are superior endplate compression fractures at L2 and L5 which are probably chronic. No subluxation identified. IMPRESSION: 1. A variety of acute pelvic fractures, common notably fractures of the left pubic body and pubic rami; the left and possibly right sacral ala; and the right acetabular wall. No active extravasation although there is probably a small amount of hematoma in the space of Retzius. 2. Old compression fractures in the spine at T4, T5, T6, T7, T8, T 9, T10, T12, L 2, and L5. Old healed rib fractures. 3. Acute left subcapital femoral neck fracture. 4. Acute left surgical neck fracture of the humerus, probably 2 part, possibly 3 part. 5. Moderate cardiomegaly with right heart enlargement and probably reduced cardiac output. Markedly severe tortuosity of the thoracic aorta. 6. Bibasilar atelectasis in the lungs. 7. Old sternal manubrial fracture. 8. Left retro areolar and upper breast mass, possibly hematoma or malignancy. Correlate with breast exam. 9. Periportal edema in the liver. There has reduced sensitivity  in  assessing the solid organs due to a poor signal to noise ratio related to a variety of technical factors. Reviewed at the workstation with trauma surgery. Electronically Signed   By: Gaylyn Rong M.D.   On: 09/16/2016 21:04   Dg Pelvis Portable  Result Date: 09/28/2016 CLINICAL DATA:  Unwitnessed fall, severe pain in the center of the low pelvis EXAM: PORTABLE PELVIS 1-2 VIEWS COMPARISON:  None. FINDINGS: Bones appear osteopenic. Moderate to marked degenerative changes of the right hip. Right femoral head demonstrates normal positioning. Right inferior and superior pubic rami appear grossly normal. Probable fractures of the left superior and inferior pubic rami at the pubic symphysis. There is questionable step-off deformity at the left femoral head neck junction. Left femoral head does not appear dislocated. IMPRESSION: 1. Suspected fracture lucencies through the left pubic symphysis and superior pubic rami and likely inferior pubic ramus. 2. Possible step-off deformity at the left femoral head neck junction, could attempt dedicated views left hip, otherwise CT may be obtained for further evaluation. Electronically Signed   By: Jasmine Pang M.D.   On: 09/20/2016 19:26   Dg Chest Portable 1 View  Result Date: 10/07/2016 CLINICAL DATA:  Status post fall.  Shortness of breath. EXAM: PORTABLE CHEST 1 VIEW COMPARISON:  A chest radiograph 05/26/2016, chest CT 12/01/2009 FINDINGS: There is marked scoliosis of the thoracic spine. The aorta is ectatic, as demonstrated on prior CT. There is no pneumothorax or sizable pleural effusion. There are linear opacities in the left lung base, likely atelectasis, and unchanged compared to prior chest radiograph. No pulmonary edema or focal airspace consolidation. There is vertebral augmentation material seen within 1 of the mid thoracic vertebrae. IMPRESSION: No focal airspace disease or pulmonary edema. Linear opacities at the left lung base, likely atelectasis, are  unchanged compared to 05/26/2016. Electronically Signed   By: Deatra Robinson M.D.   On: 10/07/2016 19:27    A/P: Patient with fall from standing height with multiple orthopedic injuries.  Significant osteoporosis is noted. Left Hip: femoral head fracture minimal displacement.   Right:acetabular fracture  Multiple compression fractures - age indeterminate Pubic rami and sacral ala fractures Left proximal humerus fracture  Plan: will discuss management with Dr Carola Frost in AM Bedrest Splint for left UE - also obtain dedicated left shoulder xrays May require hemiarthroplasty for left hip fracture Question need for ORIF of right acetabular fracture - most likely non-op treatment.  Will defer to Dr Carola Frost Pelvic fracture - will treat non-operatively  Admit to trauma  Will follow while in hospital

## 2016-10-14 ENCOUNTER — Inpatient Hospital Stay (HOSPITAL_COMMUNITY): Payer: Medicare Other | Admitting: Certified Registered Nurse Anesthetist

## 2016-10-14 ENCOUNTER — Inpatient Hospital Stay (HOSPITAL_COMMUNITY): Payer: Medicare Other

## 2016-10-14 LAB — CBC
HCT: 46.4 % — ABNORMAL HIGH (ref 36.0–46.0)
HCT: 49.9 % — ABNORMAL HIGH (ref 36.0–46.0)
HCT: 44.4 % (ref 36.0–46.0)
HEMOGLOBIN: 15.7 g/dL — AB (ref 12.0–15.0)
HEMOGLOBIN: 14.1 g/dL (ref 12.0–15.0)
Hemoglobin: 16.2 g/dL — ABNORMAL HIGH (ref 12.0–15.0)
MCH: 29.5 pg (ref 26.0–34.0)
MCH: 29.6 pg (ref 26.0–34.0)
MCH: 29.7 pg (ref 26.0–34.0)
MCHC: 33.8 g/dL (ref 30.0–36.0)
MCHC: 32.5 g/dL (ref 30.0–36.0)
MCHC: 31.8 g/dL (ref 30.0–36.0)
MCV: 87.1 fL (ref 78.0–100.0)
MCV: 91.1 fL (ref 78.0–100.0)
MCV: 93.5 fL (ref 78.0–100.0)
PLATELETS: 88 10*3/uL — AB (ref 150–400)
PLATELETS: 73 10*3/uL — AB (ref 150–400)
Platelets: 61 10*3/uL — ABNORMAL LOW (ref 150–400)
RBC: 5.33 MIL/uL — AB (ref 3.87–5.11)
RBC: 5.48 MIL/uL — AB (ref 3.87–5.11)
RBC: 4.75 MIL/uL (ref 3.87–5.11)
RDW: 14.7 % (ref 11.5–15.5)
RDW: 15.6 % — AB (ref 11.5–15.5)
RDW: 15.9 % — AB (ref 11.5–15.5)
WBC: 24.8 10*3/uL — AB (ref 4.0–10.5)
WBC: 26.6 10*3/uL — AB (ref 4.0–10.5)
WBC: 26.5 10*3/uL — ABNORMAL HIGH (ref 4.0–10.5)

## 2016-10-14 LAB — TYPE AND SCREEN
ABO/RH(D): O NEG
ABO/RH(D): O NEG
ANTIBODY SCREEN: NEGATIVE
Antibody Screen: NEGATIVE
Unit division: 0
Unit division: 0

## 2016-10-14 LAB — COMPREHENSIVE METABOLIC PANEL
ALBUMIN: 3.1 g/dL — AB (ref 3.5–5.0)
ALK PHOS: 77 U/L (ref 38–126)
ALT: 61 U/L — AB (ref 14–54)
AST: 85 U/L — AB (ref 15–41)
Anion gap: 12 (ref 5–15)
BUN: 26 mg/dL — AB (ref 6–20)
CALCIUM: 8.4 mg/dL — AB (ref 8.9–10.3)
CHLORIDE: 106 mmol/L (ref 101–111)
CO2: 19 mmol/L — AB (ref 22–32)
CREATININE: 0.95 mg/dL (ref 0.44–1.00)
GFR calc Af Amer: >60 mL/min (ref >60)
GFR calc non Af Amer: 53 mL/min — ABNORMAL LOW (ref >60)
GLUCOSE: 220 mg/dL — AB (ref 65–99)
Potassium: 4.4 mmol/L (ref 3.5–5.1)
SODIUM: 137 mmol/L (ref 135–145)
Total Bilirubin: 1.4 mg/dL — ABNORMAL HIGH (ref 0.3–1.2)
Total Protein: 5.3 g/dL — ABNORMAL LOW (ref 6.5–8.1)

## 2016-10-14 LAB — BASIC METABOLIC PANEL
Anion gap: 22 — ABNORMAL HIGH (ref 5–15)
BUN: 28 mg/dL — ABNORMAL HIGH (ref 6–20)
CALCIUM: 9.4 mg/dL (ref 8.9–10.3)
CHLORIDE: 109 mmol/L (ref 101–111)
CO2: 13 mmol/L — ABNORMAL LOW (ref 22–32)
CREATININE: 1.96 mg/dL — AB (ref 0.44–1.00)
GFR, EST AFRICAN AMERICAN: 26 mL/min — AB (ref >60)
GFR, EST NON AFRICAN AMERICAN: 22 mL/min — AB (ref >60)
Glucose, Bld: 239 mg/dL — ABNORMAL HIGH (ref 65–99)
Potassium: 4.2 mmol/L (ref 3.5–5.1)
SODIUM: 144 mmol/L (ref 135–145)

## 2016-10-14 LAB — BLOOD GAS, ARTERIAL
ACID-BASE DEFICIT: 17.1 mmol/L — AB (ref 0.0–2.0)
Bicarbonate: 10.6 mmol/L — ABNORMAL LOW (ref 20.0–28.0)
DRAWN BY: 301361
FIO2: 100
LHR: 18 {breaths}/min
O2 SAT: 96.7 %
PEEP: 5 cmH2O
PO2 ART: 121 mmHg — AB (ref 83.0–108.0)
Patient temperature: 97.4
VT: 420 mL
pCO2 arterial: 34.2 mmHg (ref 32.0–48.0)
pH, Arterial: 7.114 — CL (ref 7.350–7.450)

## 2016-10-14 LAB — LACTIC ACID, PLASMA: LACTIC ACID, VENOUS: 9.8 mmol/L — AB (ref 0.5–1.9)

## 2016-10-14 LAB — URINALYSIS, ROUTINE W REFLEX MICROSCOPIC
BILIRUBIN URINE: NEGATIVE
GLUCOSE, UA: NEGATIVE mg/dL
HGB URINE DIPSTICK: NEGATIVE
Ketones, ur: NEGATIVE mg/dL
Leukocytes, UA: NEGATIVE
NITRITE: NEGATIVE
PH: 6.5 (ref 5.0–8.0)
Protein, ur: NEGATIVE mg/dL

## 2016-10-14 LAB — MRSA PCR SCREENING: MRSA by PCR: NEGATIVE

## 2016-10-14 LAB — ABO/RH
ABO/RH(D): O NEG
ABO/RH(D): O NEG

## 2016-10-14 LAB — TRIGLYCERIDES: TRIGLYCERIDES: 93 mg/dL (ref <150)

## 2016-10-14 LAB — BLOOD PRODUCT ORDER (VERBAL) VERIFICATION

## 2016-10-14 MED ORDER — FENTANYL CITRATE (PF) 100 MCG/2ML IJ SOLN: 50.0000 ug | INTRAMUSCULAR | Status: DC | PRN

## 2016-10-14 MED ORDER — PHENYLEPHRINE HCL 10 MG/ML IJ SOLN
INTRAMUSCULAR | Status: DC | PRN
  Administered 2016-10-14: 400 ug via INTRAVENOUS

## 2016-10-14 MED ORDER — SODIUM CHLORIDE 0.9 % IV SOLN
INTRAVENOUS | Status: DC | PRN
  Administered 2016-10-14: 10:00:00 via INTRA_ARTERIAL

## 2016-10-14 MED ORDER — VASOPRESSIN 20 UNIT/ML IV SOLN: 0.0300 [IU]/min | INTRAVENOUS | Status: DC

## 2016-10-14 MED ORDER — PROPOFOL 1000 MG/100ML IV EMUL: 0.0000 ug/kg/min | INTRAVENOUS | Status: DC

## 2016-10-14 MED ORDER — SODIUM CHLORIDE 0.9 % IV SOLN
0.0300 [IU]/min | INTRAVENOUS | Status: DC
  Administered 2016-10-14: 0.03 [IU]/min via INTRAVENOUS
  Filled 2016-10-14: qty 2

## 2016-10-14 MED ORDER — EPHEDRINE SULFATE 50 MG/ML IJ SOLN
INTRAMUSCULAR | Status: DC | PRN
Start: 2016-10-14 — End: 2016-10-14
  Administered 2016-10-14: 50 mg via INTRAVENOUS

## 2016-10-14 MED ORDER — DOCUSATE SODIUM 50 MG/5ML PO LIQD: 100.0000 mg | Freq: Two times a day (BID) | ORAL | Status: DC | PRN

## 2016-10-14 MED ORDER — EPINEPHRINE PF 1 MG/ML IJ SOLN
INTRAMUSCULAR | Status: DC | PRN
  Administered 2016-10-14: 1 mg via INTRAVENOUS

## 2016-10-14 MED ORDER — DEXTROSE 5 % IV SOLN
0.5000 ug/min | INTRAVENOUS | Status: DC
  Administered 2016-10-14: 0.5 ug/min via INTRAVENOUS
  Filled 2016-10-14: qty 4

## 2016-10-14 MED ORDER — SODIUM CHLORIDE 0.9 % IV SOLN
INTRAVENOUS | Status: DC
  Administered 2016-10-14: 11:00:00 via INTRAVENOUS
  Filled 2016-10-14: qty 1000

## 2016-10-14 MED ORDER — ETOMIDATE 2 MG/ML IV SOLN
INTRAVENOUS | Status: DC | PRN
  Administered 2016-10-14: 4 mg via INTRAVENOUS

## 2016-10-14 MED ORDER — ROCURONIUM BROMIDE 100 MG/10ML IV SOLN
INTRAVENOUS | Status: DC | PRN
  Administered 2016-10-14: 40 mg via INTRAVENOUS

## 2016-10-14 MED ORDER — SODIUM BICARBONATE 8.4 % IV SOLN
100.0000 meq | Freq: Once | INTRAVENOUS | Status: AC
  Administered 2016-10-14: 100 meq via INTRAVENOUS
  Filled 2016-10-14: qty 100

## 2016-10-14 MED ORDER — INSULIN ASPART 100 UNIT/ML ~~LOC~~ SOLN: 0.0000 [IU] | SUBCUTANEOUS | Status: DC

## 2016-10-15 MED FILL — Medication: Qty: 1 | Status: AC

## 2016-10-15 NOTE — Progress Notes (Signed)
Follow up - Trauma Critical Care  Patient Details:    Allison Callahan is an 82 y.o. female.  Lines/tubes : Urethral Catheter L. Mayford Knife, RN Double-lumen 14 Fr. (Active)  Indication for Insertion or Continuance of Catheter Unstable critical patients (first 24-48 hours) October 24, 2016  8:00 AM  Site Assessment Clean;Intact 2016-10-24  8:00 AM  Catheter Maintenance Bag below level of bladder;Catheter secured;Drainage bag/tubing not touching floor;Insertion date on drainage bag;No dependent loops;Seal intact;Bag emptied prior to transport 2016/10/24  8:00 AM  Collection Container Standard drainage bag 10-24-16  8:00 AM  Securement Method Securing device (Describe) Oct 24, 2016  8:00 AM  Output (mL) 60 mL 10-24-16  6:00 AM    Microbiology/Sepsis markers: Results for orders placed or performed during the hospital encounter of 05/20/16  Urine culture     Status: Abnormal   Collection Time: 05/20/16  6:46 PM  Result Value Ref Range Status   Specimen Description URINE, RANDOM  Final   Special Requests none Normal  Final   Culture MULTIPLE SPECIES PRESENT, SUGGEST RECOLLECTION (A)  Final   Report Status 05/22/2016 FINAL  Final    Anti-infectives:  Anti-infectives    None      Best Practice/Protocols:  VTE Prophylaxis: Mechanical .  Consults: Treatment Team:  Venita Lick, MD    Studies:    Events:  Subjective:    Overnight Issues:   Objective:  Vital signs for last 24 hours: Temp:  [96 F (35.6 C)-98.4 F (36.9 C)] 97.5 F (36.4 C) (10/31 0800) Pulse Rate:  [60-122] 113 (10/31 0430) Resp:  [17-39] 36 (10/31 0800) BP: (74-104)/(33-89) 93/78 (10/31 0800) SpO2:  [77 %-100 %] 100 % (10/31 0800) FiO2 (%):  [55 %-100 %] 100 % (10/31 0432) Weight:  [34 kg (75 lb)-36.1 kg (79 lb 9.4 oz)] 36.1 kg (79 lb 9.4 oz) (10/31 0000)  Hemodynamic parameters for last 24 hours:    Intake/Output from previous day: 10/30 0701 - 10/31 0700 In: 3270 [I.V.:2600;  Blood:670] Out: 310 [Urine:310]  Intake/Output this shift: Total I/O In: 75 [I.V.:75] Out: -   Vent settings for last 24 hours: FiO2 (%):  [55 %-100 %] 100 %  Physical Exam:  General: on NRB Neuro: arouses and F/C Resp: RR 30s, CTA CVS: RRR GI: soft, NT, pelvic binder removed Extremities: tender deform L hum  Results for orders placed or performed during the hospital encounter of 09/28/2016 (from the past 24 hour(s))  Type and screen [facility]     Status: None   Collection Time: 09/21/2016  7:08 PM  Result Value Ref Range   ABO/RH(D) O NEG    Antibody Screen NEG    Sample Expiration 10/16/2016    Unit Number [id number]    Blood Component Type RED CELLS,LR    Unit division 00    Status of Unit ISSUED,FINAL    Transfusion Status OK TO TRANSFUSE    Crossmatch Result COMPATIBLE    Unit tag comment VERBAL ORDERS PER DR DR KNOTT    Unit Number [id number]    Blood Component Type RED CELLS,LR    Unit division 00    Status of Unit ISSUED,FINAL    Transfusion Status OK TO TRANSFUSE    Crossmatch Result COMPATIBLE    Unit tag comment VERBAL ORDERS PER DR DR Clydene Pugh   ABO/Rh     Status: None   Collection Time: 09/24/2016  7:08 PM  Result Value Ref Range   ABO/RH(D) O NEG   Comprehensive metabolic panel  Status: Abnormal   Collection Time: 2016/05/20  7:09 PM  Result Value Ref Range   Sodium 138 135 - 145 mmol/L   Potassium 3.8 3.5 - 5.1 mmol/L   Chloride 100 (L) 101 - 111 mmol/L   CO2 28 22 - 32 mmol/L   Glucose, Bld 161 (H) 65 - 99 mg/dL   BUN 27 (H) 6 - 20 mg/dL   Creatinine, Ser 4.010.58 0.44 - 1.00 mg/dL   Calcium 8.8 (L) 8.9 - 10.3 mg/dL   Total Protein 6.1 (L) 6.5 - 8.1 g/dL   Albumin 3.7 3.5 - 5.0 g/dL   AST 33 15 - 41 U/L   ALT 35 14 - 54 U/L   Alkaline Phosphatase 87 38 - 126 U/L   Total Bilirubin 0.5 0.3 - 1.2 mg/dL   GFR calc non Af Amer >60 >60 mL/min   GFR calc Af Amer >60 >60 mL/min   Anion gap 10 5 - 15  CBC     Status: Abnormal    Collection Time: 2016/05/20  7:09 PM  Result Value Ref Range   WBC 13.5 (H) 4.0 - 10.5 K/uL   RBC 4.25 3.87 - 5.11 MIL/uL   Hemoglobin 12.7 12.0 - 15.0 g/dL   HCT 02.738.4 25.336.0 - 66.446.0 %   MCV 90.4 78.0 - 100.0 fL   MCH 29.9 26.0 - 34.0 pg   MCHC 33.1 30.0 - 36.0 g/dL   RDW 40.312.7 47.411.5 - 25.915.5 %   Platelets 139 (L) 150 - 400 K/uL  Ethanol     Status: None   Collection Time: 2016/05/20  7:09 PM  Result Value Ref Range   Alcohol, Ethyl (B) <5 <5 mg/dL  Protime-INR     Status: None   Collection Time: 2016/05/20  7:09 PM  Result Value Ref Range   Prothrombin Time 12.9 11.4 - 15.2 seconds   INR 0.98   I-Stat Troponin, ED (not at [facility])     Status: None   Collection Time: 2016/05/20  7:23 PM  Result Value Ref Range   Troponin i, poc 0.03 0.00 - 0.08 ng/mL   Comment 3          I-Stat Chem 8, ED     Status: Abnormal   Collection Time: 2016/05/20  7:24 PM  Result Value Ref Range   Sodium 139 135 - 145 mmol/L   Potassium 3.8 3.5 - 5.1 mmol/L   Chloride 97 (L) 101 - 111 mmol/L   BUN 28 (H) 6 - 20 mg/dL   Creatinine, Ser 5.630.60 0.44 - 1.00 mg/dL   Glucose, Bld 875157 (H) 65 - 99 mg/dL   Calcium, Ion 6.431.11 (L) 1.15 - 1.40 mmol/L   TCO2 31 0 - 100 mmol/L   Hemoglobin 12.9 12.0 - 15.0 g/dL   HCT 32.938.0 51.836.0 - 84.146.0 %  I-Stat CG4 Lactic Acid, ED     Status: Abnormal   Collection Time: 2016/05/20  7:25 PM  Result Value Ref Range   Lactic Acid, Venous 2.11 (HH) 0.5 - 1.9 mmol/L   Comment NOTIFIED PHYSICIAN   Prepare fresh frozen plasma     Status: None   Collection Time: 2016/05/20  7:51 PM  Result Value Ref Range   Unit Number [id number]    Blood Component Type LIQ PLASMA    Unit division 00    [facility]    Unit tag comment VERBAL ORDERS PER DR CARDAMA    Transfusion Status OK TO TRANSFUSE    Unit  Number [id number]    Blood Component Type LIQ PLASMA    Unit division 00    Status of [facility]    Unit tag comment VERBAL ORDERS PER DR CARDAMA    Transfusion Status OK TO  TRANSFUSE   CBC with Differential     Status: Abnormal   Collection Time: 15-Sep-2016  9:02 PM  Result Value Ref Range   WBC 21.2 (H) 4.0 - 10.5 K/uL   RBC 5.10 3.87 - 5.11 MIL/uL   Hemoglobin 15.6 (H) 12.0 - 15.0 g/dL   HCT 56.244.8 13.036.0 - 86.546.0 %   MCV 87.8 78.0 - 100.0 fL   MCH 30.6 26.0 - 34.0 pg   MCHC 34.8 30.0 - 36.0 g/dL   RDW 78.413.5 69.611.5 - 29.515.5 %   Platelets 148 (L) 150 - 400 K/uL   Neutrophils Relative % 61 %   Lymphocytes Relative 8 %   Monocytes Relative 31 %   Eosinophils Relative 0 %   Basophils Relative 0 %   Neutro Abs 12.9 (H) 1.7 - 7.7 K/uL   Lymphs Abs 1.7 0.7 - 4.0 K/uL   Monocytes Absolute 6.6 (H) 0.1 - 1.0 K/uL   Eosinophils Absolute 0.0 0.0 - 0.7 K/uL   Basophils Absolute 0.0 0.0 - 0.1 K/uL   RBC Morphology BURR CELLS    WBC Morphology INCREASED BANDS (>20% BANDS)   Basic metabolic panel     Status: Abnormal   Collection Time: 15-Sep-2016  9:02 PM  Result Value Ref Range   Sodium 136 135 - 145 mmol/L   Potassium 4.7 3.5 - 5.1 mmol/L   Chloride 107 101 - 111 mmol/L   CO2 17 (L) 22 - 32 mmol/L   Glucose, Bld 144 (H) 65 - 99 mg/dL   BUN 24 (H) 6 - 20 mg/dL   Creatinine, Ser 2.840.62 0.44 - 1.00 mg/dL   Calcium 8.1 (L) 8.9 - 10.3 mg/dL   GFR calc non Af Amer >60 >60 mL/min   GFR calc Af Amer >60 >60 mL/min   Anion gap 12 5 - 15  I-STAT 3, arterial blood gas (G3+)     Status: Abnormal   Collection Time: 15-Sep-2016 11:29 PM  Result Value Ref Range   pH, Arterial 7.320 (L) 7.350 - 7.450   pCO2 arterial 46.5 32.0 - 48.0 mmHg   pO2, Arterial 50.0 (L) 83.0 - 108.0 mmHg   Bicarbonate 24.2 20.0 - 28.0 mmol/L   TCO2 26 0 - 100 mmol/L   O2 Saturation 83.0 %   Acid-base deficit 3.0 (H) 0.0 - 2.0 mmol/L   Patient temperature 97.3 F    Sample type ARTERIAL   Urinalysis, Routine w reflex microscopic (not at [facility])     Status: Abnormal   Collection Time: 10/01/2016  1:57 AM  Result Value Ref Range   Color, Urine YELLOW YELLOW   APPearance CLEAR CLEAR   Specific Gravity, Urine  >1.046 (H) 1.005 - 1.030   pH 6.5 5.0 - 8.0   Glucose, UA NEGATIVE NEGATIVE mg/dL   Hgb urine dipstick NEGATIVE NEGATIVE   Bilirubin Urine NEGATIVE NEGATIVE   Ketones, ur NEGATIVE NEGATIVE mg/dL   Protein, ur NEGATIVE NEGATIVE mg/dL   Nitrite NEGATIVE NEGATIVE   Leukocytes, UA NEGATIVE NEGATIVE  CBC     Status: Abnormal   Collection Time: 09/20/2016  3:00 AM  Result Value Ref Range   WBC 24.8 (H) 4.0 - 10.5 K/uL   RBC 5.33 (H) 3.87 - 5.11 MIL/uL   Hemoglobin  15.7 (H) 12.0 - 15.0 g/dL   HCT 13.0 (H) 86.5 - 78.4 %   MCV 87.1 78.0 - 100.0 fL   MCH 29.5 26.0 - 34.0 pg   MCHC 33.8 30.0 - 36.0 g/dL   RDW 69.6 29.5 - 28.4 %   Platelets 88 (L) 150 - 400 K/uL  Comprehensive metabolic panel     Status: Abnormal   Collection Time: 10/11/2016  3:00 AM  Result Value Ref Range   Sodium 137 135 - 145 mmol/L   Potassium 4.4 3.5 - 5.1 mmol/L   Chloride 106 101 - 111 mmol/L   CO2 19 (L) 22 - 32 mmol/L   Glucose, Bld 220 (H) 65 - 99 mg/dL   BUN 26 (H) 6 - 20 mg/dL   Creatinine, Ser 1.32 0.44 - 1.00 mg/dL   Calcium 8.4 (L) 8.9 - 10.3 mg/dL   Total Protein 5.3 (L) 6.5 - 8.1 g/dL   Albumin 3.1 (L) 3.5 - 5.0 g/dL   AST 85 (H) 15 - 41 U/L   ALT 61 (H) 14 - 54 U/L   Alkaline Phosphatase 77 38 - 126 U/L   Total Bilirubin 1.4 (H) 0.3 - 1.2 mg/dL   GFR calc non Af Amer 53 (L) >60 mL/min   GFR calc Af Amer >60 >60 mL/min   Anion gap 12 5 - 15    Assessment & Plan: Present on Admission: . Fall    LOS: 1 day   Additional comments:I reviewed the patient's new clinical lab test results. . Fall Resp failure - worsening, will proceed with intubation and ventilator support L hip FX R acetab FX L pubic rami and R sacral ala FX L humerus FX - all ortho injuries to be evaluated by Dr. Carola Frost Thrombocytopenia - no Lovenox FEN - IVF, consider TF once ortho plan known VTE - PAS, see above DIspo - ICU I spoke with her husband. He wants all efforts available to help her at this point including  intubation.  Critical Care Total Time*: 30 Minutes  [name], MD, MPH, FACS Trauma: [phone] General Surgery: [phone]  09/18/2016  *Care during the described time interval was provided by me. I have reviewed this patient's available data, including medical history, events of note, physical examination and test results as part of my evaluation.  Patient ID: Allison Callahan, female   DOB: September 19, 1930, 82 y.o.   MRN: [id number]

## 2016-10-15 NOTE — Progress Notes (Signed)
RT attempted ABG on patient and was not able to obtain any blood due to BP being low. RN aware. RT asked RN to call if blood pressure increases in order for RT to obtain a blood gas.

## 2016-10-15 NOTE — Progress Notes (Signed)
RT pulled ETT back 1 cm to 19 at the lip per Dr Janee Mornhompson. Patient had good return volumes. RT will continue to monitor.

## 2016-10-15 NOTE — Progress Notes (Signed)
Patient with acute pulmonary distress No intubated Active code Will sign off - no indication for surgical management of hip fracture.

## 2016-10-15 NOTE — Progress Notes (Signed)
Patient ID: Allison OvensMary C Von Stamwitz, female   DOB: [birth year], 82 y.o.   MRN: [id number] Several more rounds of CPR. After last ROSC she bradied down again quickly. We will terminate resuscitative efforts as it is futile. Husband and clergy at the bedside.  Violeta GelinasBurke Zari Cly, MD, MPH, FACS Trauma: [phone] General Surgery: [phone]

## 2016-10-15 NOTE — Progress Notes (Addendum)
Patient ID: Allison OvensMary C Von Stamwitz, female   DOB: 20-Sep-1930, 82 y.o.   MRN: [id number] Time of death [id number]. Additional critical care time 70 min - multiple codes. I spoke with her husband at the bedside.  Violeta GelinasBurke Rayner Erman, MD, MPH, FACS Trauma: [phone] General Surgery: [phone]

## 2016-10-15 NOTE — Progress Notes (Addendum)
Pt arrived from ED. Noted to be Pale, tachypneic, tachycardic, and O2 sats 70-80% on 15L nonrebreather. Pt only able to state name and very fidgety.  MD paged, made aware. Order for ABG.  Pt settled in bed, sats begin to raise. ABG results back and called to MD. Addressed tachycardia 116bpm, low BP (see chart), mentation, parameters to call, pelvic binder, and foley order. All concerns addressed. Will continue to monitor.

## 2016-10-15 NOTE — Progress Notes (Signed)
Pt's wedding band given to her husband before he left today.

## 2016-10-15 NOTE — Progress Notes (Signed)
Patient ID: Allison Callahan, female   DOB: Jul 06, 1930, 82 y.o.   MRN: [id number] Epi bolus has short effect. Will try epi drip. I spoke with her husband. He callef their son. Violeta GelinasBurke Mahum Betten, MD, MPH, FACS Trauma: [phone] General Surgery: [phone]

## 2016-10-15 NOTE — Progress Notes (Signed)
Patient ID: Allison Callahan, female   DOB: 1930/03/26, 82 y.o.   MRN: [id number] BP 50s. Vaso going. Gave 1/2 amp epi. I spoke with her husband at the bedside. Still full code at this time. Prognosis is looking poor.  Allison GelinasBurke Aleicia Kenagy, MD, MPH, FACS Trauma: [phone] General Surgery: [phone]

## 2016-10-15 NOTE — Care Management Note (Signed)
Case Management Note  Patient Details  Name: Allison Callahan MRN: [id number] Date of Birth: March 09, 1930  Subjective/Objective:  Pt admitted on 09/17/2016 s/p ground level fall with multiple pelvic fractures, Lt femoral fx, Lt humeral fx, and multiple old thoracic spinal fx.  PTA, pt resided at home with spouse.                    Action/Plan: Prognosis guarded.  Will follow, offer support as pt progresses.    Expected Discharge Date:                  Expected Discharge Plan:     In-House Referral:  Chaplain, Clinical Social Work  Discharge planning Services  CM Consult  Post Acute Care Choice:    Choice offered to:     DME Arranged:    DME Agency:     HH Arranged:    HH Agency:     Status of Service:  In process, will continue to follow  If discussed at Long Length of Stay Meetings, dates discussed:    Additional Comments:  Quintella BatonJulie W. Jenavie Stanczak, RN, BSN  Trauma/Neuro ICU Case Manager [phone]

## 2016-10-15 NOTE — Progress Notes (Signed)
Visited w/ pt's husband of 51 yrs by pt's bedside (who reached out to hug me as soon as identified self). Pt's situation is grave. Nursing staff is trying to reach their only child, son in [place], to come if possible -- and to ensure staff clearly communicates the serious medical condition of his wife to husband who has been caring for pt at home, is now under great stress., and may not always appear fully to grasp it.. Provided requested prayer and spiritual/emotional support. Catholic priest was with them last night (the priest on call [facility] and provided pt w/ sacramental anointing. Husband said he'd been in touch w/ [facility] this morning, and hopes they can get a msg to their retired Copywriter, advertisingdeacon, who knew pt, to come visit them. Advised husband that the communion minister to come tomorrow will also be from [facility] and he can ask for a chaplain to be paged at any time. Husband was at one time both a Environmental consultantcommunion minister and a Engineer, building serviceslector in [employer], so has his own copy of/ and familiarity with scripture.   Sep 25, 2016 1200  Clinical Encounter Type  Visited With Patient and family together  Visit Type Follow-up;Psychological support;Spiritual support;Social support  Referral From Chaplain  Spiritual Encounters  Spiritual Needs Prayer;Emotional  Stress Factors  Patient Stress Factors Health changes;Loss of control  Family Stress Factors Family relationships;Health changes;Loss of control     Sep 25, 2016 1200  Clinical Encounter Type  Visited With Patient and family together  Visit Type Follow-up;Psychological support;Spiritual support;Social support  Referral From Chaplain  Spiritual Encounters  Spiritual Needs Prayer;Emotional  Stress Factors  Patient Stress Factors Health changes;Loss of control  Family Stress Factors Family relationships;Health changes;Loss of control

## 2016-10-15 NOTE — Anesthesia Procedure Notes (Signed)
Procedure Name: Intubation Date/Time: 10/03/2016 10:02 AM Performed by: Everlene BallsHAYES, Emauri Krygier T Pre-anesthesia Checklist: Patient identified, Emergency Drugs available, Suction available, Patient being monitored and Timeout performed Patient Re-evaluated:Patient Re-evaluated prior to inductionOxygen Delivery Method: Non-rebreather mask and Ambu bag Preoxygenation: Pre-oxygenation with 100% oxygen Intubation Type: IV induction Laryngoscope Size: McGraph, Mac and 3 Grade View: Grade I Tube type: Subglottic suction tube Tube size: 7.5 mm Number of attempts: 1 Airway Equipment and Method: Stylet Placement Confirmation: ETT inserted through vocal cords under direct vision,  positive ETCO2 and CO2 detector Secured at: 21 cm Tube secured with: Tape Dental Injury: Teeth and Oropharynx as per pre-operative assessment

## 2016-10-15 NOTE — Progress Notes (Signed)
Patient ID: Allison OvensMary C Von Stamwitz, female   DOB: [birth year], 82 y.o.   MRN: [id number] Brief PEA arrest after intubation. ROSC after 1 round of epi.  Hb 15.7.Re-check labs later today. RT trying to place art line. Full support on vent.  Violeta GelinasBurke Skylyn Slezak, MD, MPH, FACS Trauma: [phone] General Surgery: [phone]

## 2016-10-15 NOTE — Progress Notes (Signed)
Responded to page to support husband at bedside. Patient is repeatily coding and is actively dying. Husband insisting that staff continue all measures to  Keep patient alive. Husband said his priest and another Chaplain had already been and he was ok.  I informed Dr. Janee Mornhompson and remain outside patient door in case my services were needed.  Venida JarvisWatlington, Tessla Spurling, [place],BCC,  Pager [phone]

## 2016-10-15 NOTE — Progress Notes (Signed)
Asystole noted on the monitor at 1519.  Two nurses were unable to auscultate  heart sounds. Husband at the bedside.

## 2016-10-15 DEATH — deceased

## 2016-10-29 DIAGNOSIS — S3282XA Multiple fractures of pelvis without disruption of pelvic ring, initial encounter for closed fracture: Secondary | ICD-10-CM | POA: Diagnosis present

## 2016-10-29 DIAGNOSIS — S7292XA Unspecified fracture of left femur, initial encounter for closed fracture: Secondary | ICD-10-CM | POA: Diagnosis present

## 2016-10-29 DIAGNOSIS — J9600 Acute respiratory failure, unspecified whether with hypoxia or hypercapnia: Secondary | ICD-10-CM | POA: Diagnosis not present

## 2016-10-29 DIAGNOSIS — D62 Acute posthemorrhagic anemia: Secondary | ICD-10-CM | POA: Diagnosis present

## 2016-10-29 DIAGNOSIS — IMO0002 Reserved for concepts with insufficient information to code with codable children: Secondary | ICD-10-CM

## 2016-10-29 DIAGNOSIS — R571 Hypovolemic shock: Secondary | ICD-10-CM | POA: Diagnosis present

## 2016-10-29 DIAGNOSIS — S42309A Unspecified fracture of shaft of humerus, unspecified arm, initial encounter for closed fracture: Secondary | ICD-10-CM | POA: Diagnosis present

## 2016-10-29 DIAGNOSIS — I469 Cardiac arrest, cause unspecified: Secondary | ICD-10-CM | POA: Diagnosis not present

## 2016-11-14 NOTE — Discharge Summary (Signed)
Physician Death Summary  Patient ID: Allison Callahan MRN: [id number] DOB/AGE: [birth year] 82 y.o.  Admit date: 10/12/2016 Date of death: 12/26/2015  Discharge Diagnoses Patient Active Problem List   Diagnosis Date Noted  . Multiple pelvic fractures (HCC) 10/29/2016  . Hypovolemic shock (HCC) 10/29/2016  . Humerus fracture 10/29/2016  . Acute respiratory failure (HCC) 10/29/2016  . Cardiac arrest (HCC) 10/29/2016  . Femur fracture, left (HCC) 10/29/2016  . Acute blood loss anemia 10/29/2016  . Fall 09/25/2016  . Adjustment disorder with depressed mood 10/07/2014  . HYPERLIPIDEMIA 09/09/2007  . OSTEOPOROSIS 09/09/2007  . HEADACHE 09/09/2007  . SKIN CANCER, HX OF 09/06/2007    Consultants Dr. Venita Lickahari Brooks for orthopedic surgery   Procedures None   HPI: Allison Call[facility] level fall. She fell at home, had immediate pain, and became increasingly confused and weak. [facility] she was hypotensive and was noted to have pelvic fractures on x-ray. FAST exam was negative. She had a sheet placed to bind the pelvis, received two units of packed red blood cells, and [facility]. She was adequately resuscitated. Her workup included CT scans of the head, cervical spine, chest, abdomen, and pelvis as well as extremity x-rays which showed the above-mentioned injuries. She was admitted to the trauma service and orthopedic surgery was consulted.   Hospital Course: Orthopedic surgery recommended no immediate operative intervention and planned to turn the case over to the orthopedic trauma specialist the following day. By the next morning she had developed worsening respiratory failure and was intubated. She suffered a brief cardiac arrest after intubation but revived after one round of epinephrine. Over the next 4 hours she arrested multiple times but eventually stopped responding for more than a few minutes to interventions. Further  attempts were deemed futile and she was declared dead after the last attempt.    Signed: Freeman CaldronMichael J. Yerachmiel Spinney, PA-C Pager: [phone] General Trauma PA Pager: [phone] 10/29/2016, 3:07 PM
